# Patient Record
Sex: Male | Born: 1970
Health system: Southern US, Community
[De-identification: ages and names within clinical notes are randomized; demographics above are authoritative.]

## PROBLEM LIST (undated history)

## (undated) DIAGNOSIS — G809 Cerebral palsy, unspecified: Secondary | ICD-10-CM

## (undated) DIAGNOSIS — F32A Depression, unspecified: Secondary | ICD-10-CM

## (undated) DIAGNOSIS — F79 Unspecified intellectual disabilities: Secondary | ICD-10-CM

## (undated) DIAGNOSIS — K219 Gastro-esophageal reflux disease without esophagitis: Secondary | ICD-10-CM

## (undated) DIAGNOSIS — R569 Unspecified convulsions: Secondary | ICD-10-CM

## (undated) DIAGNOSIS — F329 Major depressive disorder, single episode, unspecified: Secondary | ICD-10-CM

## (undated) DIAGNOSIS — F419 Anxiety disorder, unspecified: Secondary | ICD-10-CM

## (undated) HISTORY — PX: OTHER SURGICAL HISTORY: SHX169

---

## 2010-06-17 ENCOUNTER — Emergency Department (HOSPITAL_COMMUNITY): Admission: EM | Admit: 2010-06-17 | Discharge: 2010-06-17 | Payer: Self-pay | Admitting: Emergency Medicine

## 2010-12-23 LAB — CBC
HCT: 45.2 % (ref 39.0–52.0)
Hemoglobin: 15.5 g/dL (ref 13.0–17.0)
MCHC: 34.2 g/dL (ref 30.0–36.0)
WBC: 5.9 10*3/uL (ref 4.0–10.5)

## 2010-12-23 LAB — COMPREHENSIVE METABOLIC PANEL
ALT: 34 U/L (ref 0–53)
Alkaline Phosphatase: 74 U/L (ref 39–117)
CO2: 32 mEq/L (ref 19–32)
GFR calc non Af Amer: >60 mL/min (ref >60)
Glucose, Bld: 106 mg/dL — ABNORMAL HIGH (ref 70–99)
Potassium: 4.4 mEq/L (ref 3.5–5.1)
Sodium: 134 mEq/L — ABNORMAL LOW (ref 135–145)
Total Bilirubin: 0.4 mg/dL (ref 0.3–1.2)

## 2010-12-23 LAB — URINALYSIS, ROUTINE W REFLEX MICROSCOPIC
Ketones, ur: NEGATIVE mg/dL
Nitrite: NEGATIVE
pH: >9 — ABNORMAL HIGH (ref 5.0–8.0)

## 2010-12-23 LAB — ETHANOL: Alcohol, Ethyl (B): <5 mg/dL (ref 0–10)

## 2010-12-23 LAB — RAPID URINE DRUG SCREEN, HOSP PERFORMED: Cocaine: NOT DETECTED

## 2010-12-23 LAB — DIFFERENTIAL
Basophils Relative: 0 % (ref 0–1)
Eosinophils Absolute: 0 10*3/uL (ref 0.0–0.7)
Neutrophils Relative %: 72 % (ref 43–77)

## 2012-05-20 ENCOUNTER — Emergency Department (HOSPITAL_COMMUNITY)
Admission: EM | Admit: 2012-05-20 | Discharge: 2012-05-20 | Payer: Medicare Other | Attending: Emergency Medicine | Admitting: Emergency Medicine

## 2012-05-20 ENCOUNTER — Encounter (HOSPITAL_COMMUNITY): Payer: Self-pay | Admitting: *Deleted

## 2012-05-20 DIAGNOSIS — L0291 Cutaneous abscess, unspecified: Secondary | ICD-10-CM

## 2012-05-20 DIAGNOSIS — G809 Cerebral palsy, unspecified: Secondary | ICD-10-CM | POA: Insufficient documentation

## 2012-05-20 DIAGNOSIS — F79 Unspecified intellectual disabilities: Secondary | ICD-10-CM | POA: Insufficient documentation

## 2012-05-20 DIAGNOSIS — G40909 Epilepsy, unspecified, not intractable, without status epilepticus: Secondary | ICD-10-CM | POA: Insufficient documentation

## 2012-05-20 DIAGNOSIS — IMO0002 Reserved for concepts with insufficient information to code with codable children: Secondary | ICD-10-CM | POA: Insufficient documentation

## 2012-05-20 HISTORY — DX: Unspecified intellectual disabilities: F79

## 2012-05-20 HISTORY — DX: Cerebral palsy, unspecified: G80.9

## 2012-05-20 HISTORY — DX: Unspecified convulsions: R56.9

## 2012-05-20 MED ORDER — LIDOCAINE HCL (PF) 1 % IJ SOLN
5.0000 mL | Freq: Once | INTRAMUSCULAR | Status: AC
  Administered 2012-05-20: 5 mL via INTRADERMAL
  Filled 2012-05-20: qty 5

## 2012-05-20 MED ORDER — HYDROCODONE-ACETAMINOPHEN 5-325 MG PO TABS: ORAL_TABLET | ORAL | Status: AC

## 2012-05-20 MED ORDER — SULFAMETHOXAZOLE-TRIMETHOPRIM 800-160 MG PO TABS: 1.0000 | ORAL_TABLET | Freq: Two times a day (BID) | ORAL | Status: AC

## 2012-05-20 NOTE — ED Notes (Signed)
Abscess to left forearm since Friday,

## 2012-05-21 ENCOUNTER — Encounter (HOSPITAL_COMMUNITY): Payer: Self-pay | Admitting: *Deleted

## 2012-05-21 ENCOUNTER — Emergency Department (HOSPITAL_COMMUNITY)
Admission: EM | Admit: 2012-05-21 | Discharge: 2012-05-21 | Disposition: A | Discharge status: Home / Self Care | Payer: Medicare Other | Attending: Emergency Medicine | Admitting: Emergency Medicine

## 2012-05-21 DIAGNOSIS — L0291 Cutaneous abscess, unspecified: Secondary | ICD-10-CM

## 2012-05-21 DIAGNOSIS — Z4801 Encounter for change or removal of surgical wound dressing: Secondary | ICD-10-CM | POA: Insufficient documentation

## 2012-05-21 DIAGNOSIS — F79 Unspecified intellectual disabilities: Secondary | ICD-10-CM | POA: Insufficient documentation

## 2012-05-21 DIAGNOSIS — G809 Cerebral palsy, unspecified: Secondary | ICD-10-CM | POA: Insufficient documentation

## 2012-05-21 NOTE — ED Notes (Signed)
Recheck of lt arm, had I and D done yesterday.

## 2012-05-21 NOTE — ED Provider Notes (Signed)
History     CSN: [id number]  Arrival date & time 05/21/12  1506   First MD Initiated Contact with Patient 05/21/12 1604      Chief Complaint  Patient presents with  . Wound Check    (Consider location/radiation/quality/duration/timing/severity/associated sxs/prior treatment) Patient is a 41 y.o. male presenting with wound check. The history is provided by the patient and a caregiver.  Wound Check  He was treated in the ED yesterday. Previous treatment in the ED includes I&D of abscess. Treatments since wound repair include oral antibiotics. Fever duration: no fever. There has been colored discharge from the wound. The redness has improved. The swelling has improved. The pain has improved. He has no difficulty moving the affected extremity or digit.    Past Medical History  Diagnosis Date  . Seizures   . Mental retardation   . Cerebral palsy     History reviewed. No pertinent past surgical history.  History reviewed. No pertinent family history.  History  Substance Use Topics  . Smoking status: Never Smoker   . Smokeless tobacco: Not on file  . Alcohol Use: No      Review of Systems  Constitutional: Negative for fever and chills.  Gastrointestinal: Negative for nausea and vomiting.  Musculoskeletal: Negative for joint swelling and arthralgias.  Skin: Positive for color change.       Abscess   Hematological: Negative for adenopathy.  All other systems reviewed and are negative.    Allergies  Review of patient's allergies indicates no known allergies.  Home Medications   Current Outpatient Rx  Name Route Sig Dispense Refill  . ACETAMINOPHEN 325 MG PO TABS Oral Take 650 mg by mouth every 6 (six) hours as needed. Pain/Fever    . CARBAMAZEPINE ER 200 MG PO TB12 Oral Take 400 mg by mouth 2 (two) times daily.    Marland Kitchen CARBOXYMETHYLCELLULOSE SODIUM 0.5 % OP SOLN Both Eyes Place 1 drop into both eyes 4 (four) times daily.    Marland Kitchen CLONAZEPAM 0.5 MG PO TABS Oral Take 0.5 mg  by mouth 2 (two) times daily.    Marland Kitchen FLUVOXAMINE MALEATE 100 MG PO TABS Oral Take 100 mg by mouth 2 (two) times daily.    Marland Kitchen GABAPENTIN 800 MG PO TABS Oral Take 800 mg by mouth 4 (four) times daily.    Marland Kitchen HYDROCODONE-ACETAMINOPHEN 5-325 MG PO TABS  Take one-two tabs po q 4-6 hrs prn pain 20 tablet 0  . SULFAMETHOXAZOLE-TRIMETHOPRIM 800-160 MG PO TABS Oral Take 1 tablet by mouth 2 (two) times daily. 28 tablet 0  . ZOLPIDEM TARTRATE 10 MG PO TABS Oral Take 10 mg by mouth at bedtime.      BP 124/80  Pulse 82  Temp 97.6 F (36.4 C) (Oral)  Resp 18  SpO2 99%  Physical Exam  Nursing note and vitals reviewed. Constitutional: He is oriented to person, place, and time. He appears well-developed and well-nourished. No distress.  HENT:  Head: Normocephalic and atraumatic.  Cardiovascular: Normal rate, regular rhythm and normal heart sounds.   Pulmonary/Chest: Effort normal and breath sounds normal.  Neurological: He is alert and oriented to person, place, and time. He exhibits normal muscle tone. Coordination normal.  Skin: Skin is warm. There is erythema.       Abscess to the left upper arm with previus I&D.  Packing in place with mild to moderate drainage still present.  No increasing erythema.  Pt still has full ROM of the arm.  Radial pulse brisk,  distal sensation intact    ED Course  Procedures (including critical care time)  Labs Reviewed - No data to display      MDM    Patient seen here today for recheck of abscess to the left forearm.  Pain improved.  Moderate amt of drainage still present.  Previous iodoform packing was removed by me and abscess was repacked again with 1/4 inch iodoform.  Pt tolerated well.    Surrounding erythema improving.  Pt currently taking bactrim DS.  Pt and care taker agree to return here on Wednesday for another recheck .    Jestina Stephani L. [place], [place] 05/24/12 2225

## 2012-05-23 ENCOUNTER — Encounter (HOSPITAL_COMMUNITY): Payer: Self-pay

## 2012-05-23 ENCOUNTER — Emergency Department (HOSPITAL_COMMUNITY)
Admission: EM | Admit: 2012-05-23 | Discharge: 2012-05-23 | Disposition: A | Discharge status: Home / Self Care | Payer: Medicare Other | Attending: Emergency Medicine | Admitting: Emergency Medicine

## 2012-05-23 DIAGNOSIS — R569 Unspecified convulsions: Secondary | ICD-10-CM | POA: Insufficient documentation

## 2012-05-23 DIAGNOSIS — F79 Unspecified intellectual disabilities: Secondary | ICD-10-CM | POA: Insufficient documentation

## 2012-05-23 DIAGNOSIS — L0291 Cutaneous abscess, unspecified: Secondary | ICD-10-CM

## 2012-05-23 DIAGNOSIS — IMO0002 Reserved for concepts with insufficient information to code with codable children: Secondary | ICD-10-CM | POA: Insufficient documentation

## 2012-05-23 DIAGNOSIS — G809 Cerebral palsy, unspecified: Secondary | ICD-10-CM | POA: Insufficient documentation

## 2012-05-23 NOTE — ED Provider Notes (Signed)
Medical screening examination/treatment/procedure(s) were performed by non-physician practitioner and as supervising physician I was immediately available for consultation/collaboration.   Benny Lennert, MD 05/23/12 [phone]

## 2012-05-23 NOTE — ED Notes (Signed)
Wound to left arm. Here for recheck.

## 2012-05-23 NOTE — ED Provider Notes (Signed)
History     CSN: [id number]  Arrival date & time 05/23/12  1191   First MD Initiated Contact with Patient 05/23/12 [phone]      Chief Complaint  Patient presents with  . Wound Check    (Consider location/radiation/quality/duration/timing/severity/associated sxs/prior treatment) Patient is a 41 y.o. male presenting with wound check. The history is provided by the patient.  Wound Check  He was treated in the ED 2 to 3 days ago. Previous treatment in the ED includes I&D of abscess. Treatments since wound repair include oral antibiotics and regular soap and water washings. Fever duration: no fever. There has been no drainage from the wound. The redness has improved. The swelling has improved. The pain has no pain. He has no difficulty moving the affected extremity or digit.    Past Medical History  Diagnosis Date  . Seizures   . Mental retardation   . Cerebral palsy     History reviewed. No pertinent past surgical history.  No family history on file.  History  Substance Use Topics  . Smoking status: Never Smoker   . Smokeless tobacco: Not on file  . Alcohol Use: No      Review of Systems  Constitutional: Negative for fever and chills.  Gastrointestinal: Negative for nausea and vomiting.  Musculoskeletal: Negative for joint swelling and arthralgias.  Skin: Positive for color change.       Abscess   Hematological: Negative for adenopathy.  All other systems reviewed and are negative.    Allergies  Review of patient's allergies indicates no known allergies.  Home Medications   Current Outpatient Rx  Name Route Sig Dispense Refill  . ACETAMINOPHEN 325 MG PO TABS Oral Take 650 mg by mouth every 6 (six) hours as needed. Pain/Fever    . CARBAMAZEPINE ER 200 MG PO TB12 Oral Take 400 mg by mouth 2 (two) times daily.    Marland Kitchen CARBOXYMETHYLCELLULOSE SODIUM 0.5 % OP SOLN Both Eyes Place 1 drop into both eyes 4 (four) times daily.    Marland Kitchen CLONAZEPAM 0.5 MG PO TABS Oral Take 0.5 mg  by mouth 2 (two) times daily.    Marland Kitchen FLUVOXAMINE MALEATE 100 MG PO TABS Oral Take 100 mg by mouth 2 (two) times daily.    Marland Kitchen GABAPENTIN 800 MG PO TABS Oral Take 800 mg by mouth 4 (four) times daily.    Marland Kitchen HYDROCODONE-ACETAMINOPHEN 5-325 MG PO TABS  Take one-two tabs po q 4-6 hrs prn pain 20 tablet 0  . SULFAMETHOXAZOLE-TRIMETHOPRIM 800-160 MG PO TABS Oral Take 1 tablet by mouth 2 (two) times daily. 28 tablet 0  . ZOLPIDEM TARTRATE 10 MG PO TABS Oral Take 10 mg by mouth at bedtime.      BP 144/88  Pulse 68  Temp 97.7 F (36.5 C)  Resp 16  SpO2 97%  Physical Exam  Nursing note and vitals reviewed. Constitutional: He is oriented to person, place, and time. He appears well-developed and well-nourished. No distress.  HENT:  Head: Normocephalic and atraumatic.  Cardiovascular: Normal rate, regular rhythm and normal heart sounds.   Pulmonary/Chest: Effort normal and breath sounds normal.  Neurological: He is alert and oriented to person, place, and time. He exhibits normal muscle tone. Coordination normal.  Skin: Skin is warm. There is erythema.       Abscess to the left forearm.  Erythema improved.  Appears to be healing.  No surrounding erythema.  Radial pulse brisk, sensation intact    ED Course  Procedures (including  critical care time)  Labs Reviewed - No data to display   1. Abscess       MDM    Abscess improving, appears to be healing . Pain improved.   Packing removed yesterday during dressing change at the nursing home.  Advised pt and care giver to cont abx, warm soaks and return if needed  The patient appears reasonably screened and/or stabilized for discharge and I doubt any other medical condition or other [facility] requiring further screening, evaluation, or treatment in the ED at this time prior to discharge.     Makalyn Lennox L. Deal[place] Koel[place], [place] 05/23/12 1019

## 2012-05-24 NOTE — ED Provider Notes (Signed)
History     CSN: [id number]  Arrival date & time 05/20/12  4098   First MD Initiated Contact with Patient 05/20/12 1015      Chief Complaint  Patient presents with  . Abscess    (Consider location/radiation/quality/duration/timing/severity/associated sxs/prior treatment) Patient is a 41 y.o. male presenting with abscess. The history is provided by the patient and a caregiver.  Abscess  This is a new problem. The current episode started less than one week ago. The onset was gradual. The problem occurs occasionally. The problem has been gradually worsening. The abscess is present on the left arm. The problem is moderate. The abscess is characterized by painfulness, redness and swelling. It is unknown what he was exposed to. Pertinent negatives include no decrease in physical activity, no fever and no vomiting. His past medical history does not include skin abscesses in family. There were no sick contacts. He has received no recent medical care.    Past Medical History  Diagnosis Date  . Seizures   . Mental retardation   . Cerebral palsy     History reviewed. No pertinent past surgical history.  No family history on file.  History  Substance Use Topics  . Smoking status: Never Smoker   . Smokeless tobacco: Not on file  . Alcohol Use: No      Review of Systems  Constitutional: Negative for fever and chills.  Gastrointestinal: Negative for nausea and vomiting.  Musculoskeletal: Negative for joint swelling and arthralgias.  Skin: Positive for color change.       Abscess   Hematological: Negative for adenopathy.  All other systems reviewed and are negative.    Allergies  Review of patient's allergies indicates no known allergies.  Home Medications   Current Outpatient Rx  Name Route Sig Dispense Refill  . ACETAMINOPHEN 325 MG PO TABS Oral Take 650 mg by mouth every 6 (six) hours as needed. Pain/Fever    . CARBAMAZEPINE ER 200 MG PO TB12 Oral Take 400 mg by mouth 2  (two) times daily.    Marland Kitchen CARBOXYMETHYLCELLULOSE SODIUM 0.5 % OP SOLN Both Eyes Place 1 drop into both eyes 4 (four) times daily.    Marland Kitchen CLONAZEPAM 0.5 MG PO TABS Oral Take 0.5 mg by mouth 2 (two) times daily.    Marland Kitchen FLUVOXAMINE MALEATE 100 MG PO TABS Oral Take 100 mg by mouth 2 (two) times daily.    Marland Kitchen GABAPENTIN 800 MG PO TABS Oral Take 800 mg by mouth 4 (four) times daily.    Marland Kitchen ZOLPIDEM TARTRATE 10 MG PO TABS Oral Take 10 mg by mouth at bedtime.    Marland Kitchen HYDROCODONE-ACETAMINOPHEN 5-325 MG PO TABS  Take one-two tabs po q 4-6 hrs prn pain 20 tablet 0  . SULFAMETHOXAZOLE-TRIMETHOPRIM 800-160 MG PO TABS Oral Take 1 tablet by mouth 2 (two) times daily. 28 tablet 0    BP 133/90  Pulse 85  Temp 97.4 F (36.3 C)  Resp 18  SpO2 99%  Physical Exam  Nursing note and vitals reviewed. Constitutional: He is oriented to person, place, and time. He appears well-developed and well-nourished. No distress.  HENT:  Head: Normocephalic and atraumatic.  Cardiovascular: Normal rate, regular rhythm and normal heart sounds.   Pulmonary/Chest: Effort normal and breath sounds normal.  Neurological: He is alert and oriented to person, place, and time. He exhibits normal muscle tone. Coordination normal.  Skin: Skin is warm. There is erythema.          Abscess to the  left upper arm.  Indurated with mild fluctuance.  No red streaks or drainage.     ED Course  Procedures (including critical care time)  Labs Reviewed - No data to display No results found.   1. Abscess       MDM   INCISION AND DRAINAGE Performed by: Pauline Aus L. Consent: Verbal consent obtained. Risks and benefits: risks, benefits and alternatives were discussed Type: abscess  Body area: left upper arm  Anesthesia: local infiltration  Local anesthetic: lidocaine 1% w/o epinephrine  Anesthetic total: 2 ml  Complexity: complex Blunt dissection to break up loculations  Drainage: purulent  Drainage amount: copious  Packing  material: 1/4 in iodoform gauze  Patient tolerance: Patient tolerated the procedure well with no immediate complications.    Patient is well appearing, moves left arm, elbow and wrist w/o difficulty.  Elbow joint is not involved.  Pt and care giver agree to return here tomorrow for recheck.  The patient appears reasonably screened and/or stabilized for discharge and I doubt any other medical condition or other [facility] requiring further screening, evaluation, or treatment in the ED at this time prior to discharge.   Prescribed: Bactrim DS norco #20     Bibi Economos L. [place], [place] 05/24/12 1811

## 2012-05-27 NOTE — ED Provider Notes (Signed)
Medical screening examination/treatment/procedure(s) were performed by non-physician practitioner and as supervising physician I was immediately available for consultation/collaboration.   Shelda Jakes, MD 05/27/12 [phone]

## 2012-05-28 NOTE — ED Provider Notes (Signed)
Medical screening examination/treatment/procedure(s) were performed by non-physician practitioner and as supervising physician I was immediately available for consultation/collaboration.  Donnetta Hutching, MD 05/28/12 [phone]

## 2014-03-04 ENCOUNTER — Encounter (HOSPITAL_COMMUNITY): Payer: Self-pay | Admitting: Emergency Medicine

## 2014-03-04 ENCOUNTER — Emergency Department (HOSPITAL_COMMUNITY): Payer: Medicare Other

## 2014-03-04 ENCOUNTER — Emergency Department (HOSPITAL_COMMUNITY)
Admission: EM | Admit: 2014-03-04 | Discharge: 2014-03-04 | Disposition: A | Discharge status: Home / Self Care | Payer: Medicare Other | Attending: Emergency Medicine | Admitting: Emergency Medicine

## 2014-03-04 DIAGNOSIS — Y9389 Activity, other specified: Secondary | ICD-10-CM | POA: Insufficient documentation

## 2014-03-04 DIAGNOSIS — T07XXXA Unspecified multiple injuries, initial encounter: Secondary | ICD-10-CM

## 2014-03-04 DIAGNOSIS — Z23 Encounter for immunization: Secondary | ICD-10-CM | POA: Insufficient documentation

## 2014-03-04 DIAGNOSIS — G40909 Epilepsy, unspecified, not intractable, without status epilepticus: Secondary | ICD-10-CM | POA: Insufficient documentation

## 2014-03-04 DIAGNOSIS — Y92410 Unspecified street and highway as the place of occurrence of the external cause: Secondary | ICD-10-CM | POA: Insufficient documentation

## 2014-03-04 DIAGNOSIS — S0990XA Unspecified injury of head, initial encounter: Secondary | ICD-10-CM

## 2014-03-04 DIAGNOSIS — Z79899 Other long term (current) drug therapy: Secondary | ICD-10-CM | POA: Insufficient documentation

## 2014-03-04 DIAGNOSIS — S0560XA Penetrating wound without foreign body of unspecified eyeball, initial encounter: Secondary | ICD-10-CM | POA: Insufficient documentation

## 2014-03-04 DIAGNOSIS — IMO0002 Reserved for concepts with insufficient information to code with codable children: Secondary | ICD-10-CM | POA: Insufficient documentation

## 2014-03-04 DIAGNOSIS — F79 Unspecified intellectual disabilities: Secondary | ICD-10-CM | POA: Insufficient documentation

## 2014-03-04 MED ORDER — TETANUS-DIPHTH-ACELL PERTUSSIS 5-2.5-18.5 LF-MCG/0.5 IM SUSP
0.5000 mL | Freq: Once | INTRAMUSCULAR | Status: AC
  Administered 2014-03-04: 0.5 mL via INTRAMUSCULAR
  Filled 2014-03-04: qty 0.5

## 2014-03-04 NOTE — ED Notes (Signed)
Pt presents with abrasion and bruising above right eye. Pt states he fell while getting out of his [employer].

## 2014-03-04 NOTE — Discharge Instructions (Signed)
Abrasion  An abrasion is a cut or scrape of the skin. Abrasions do not extend through all layers of the skin and most heal within 10 days. It is important to care for your abrasion properly to prevent infection.  CAUSES   Most abrasions are caused by falling on, or gliding across, the ground or other surface. When your skin rubs on something, the outer and inner layer of skin rubs off, causing an abrasion.  DIAGNOSIS   Your caregiver will be able to diagnose an abrasion during a physical exam.   TREATMENT   Your treatment depends on how large and deep the abrasion is. Generally, your abrasion will be cleaned with water and a mild soap to remove any dirt or debris. An antibiotic ointment may be put over the abrasion to prevent an infection. A bandage (dressing) may be wrapped around the abrasion to keep it from getting dirty.   You may need a tetanus shot if:  · You cannot remember when you had your last tetanus shot.  · You have never had a tetanus shot.  · The injury broke your skin.  If you get a tetanus shot, your arm may swell, get red, and feel warm to the touch. This is common and not a problem. If you need a tetanus shot and you choose not to have one, there is a rare chance of getting tetanus. Sickness from tetanus can be serious.   HOME CARE INSTRUCTIONS   · If a dressing was applied, change it at least once a day or as directed by your caregiver. If the bandage sticks, soak it off with warm water.    · Wash the area with water and a mild soap to remove all the ointment 2 times a day. Rinse off the soap and pat the area dry with a clean towel.    · Reapply any ointment as directed by your caregiver. This will help prevent infection and keep the bandage from sticking. Use gauze over the wound and under the dressing to help keep the bandage from sticking.    · Change your dressing right away if it becomes wet or dirty.    · Only take over-the-counter or prescription medicines for pain, discomfort, or fever as  directed by your caregiver.    · Follow up with your caregiver within 24 48 hours for a wound check, or as directed. If you were not given a wound-check appointment, look closely at your abrasion for redness, swelling, or pus. These are signs of infection.  SEEK IMMEDIATE MEDICAL CARE IF:   · You have increasing pain in the wound.    · You have redness, swelling, or tenderness around the wound.    · You have pus coming from the wound.    · You have a fever or persistent symptoms for more than 2 3 days.  · You have a fever and your symptoms suddenly get worse.  · You have a bad smell coming from the wound or dressing.    MAKE SURE YOU:   · Understand these instructions.  · Will watch your condition.  · Will get help right away if you are not doing well or get worse.  Document Released: 07/06/2005 Document Revised: 09/12/2012 Document Reviewed: 08/30/2011  ExitCare® Patient Information ©2014 [employer].    Head Injury, Adult  You have received a head injury. It does not appear serious at this time. Headaches and vomiting are common following head injury. It should be easy to awaken from sleeping. Sometimes it is necessary   for you to stay in the emergency department for a while for observation. Sometimes admission to the hospital may be needed. After injuries such as yours, most problems occur within the first 24 hours, but side effects may occur up to 7 10 days after the injury. It is important for you to carefully monitor your condition and contact your health care provider or seek immediate medical care if there is a change in your condition.  WHAT ARE THE TYPES OF HEAD INJURIES?  Head injuries can be as minor as a bump. Some head injuries can be more severe. More severe head injuries include:  · A jarring injury to the brain (concussion).  · A bruise of the brain (contusion). This mean there is bleeding in the brain that can cause swelling.  · A cracked skull (skull fracture).  · Bleeding in the brain that  collects, clots, and forms a bump (hematoma).  WHAT CAUSES A HEAD INJURY?  A serious head injury is most likely to happen to someone who is in a car wreck and is not wearing a seat belt. Other causes of major head injuries include bicycle or motorcycle accidents, sports injuries, and falls.  HOW ARE HEAD INJURIES DIAGNOSED?  A complete history of the event leading to the injury and your current symptoms will be helpful in diagnosing head injuries. Many times, pictures of the brain, such as CT or MRI are needed to see the extent of the injury. Often, an overnight hospital stay is necessary for observation.   WHEN SHOULD I SEEK IMMEDIATE MEDICAL CARE?   You should get help right away if:  · You have confusion or drowsiness.  · You feel sick to your stomach (nauseous) or have continued, forceful vomiting.  · You have dizziness or unsteadiness that is getting worse.  · You have severe, continued headaches not relieved by medicine. Only take over-the-counter or prescription medicines for pain, fever, or discomfort as directed by your health care provider.  · You do not have normal function of the arms or legs or are unable to walk.  · You notice changes in the black spots in the center of the colored part of your eye (pupil).  · You have a clear or bloody fluid coming from your nose or ears.  · You have a loss of vision.  During the next 24 hours after the injury, you must stay with someone who can watch you for the warning signs. This person should contact local emergency services (911 in the [place] if you have seizures, you become unconscious, or you are unable to wake up.  HOW CAN I PREVENT A HEAD INJURY IN THE FUTURE?  The most important factor for preventing major head injuries is avoiding motor vehicle accidents.  To minimize the potential for damage to your head, it is crucial to wear seat belts while riding in motor vehicles. Wearing helmets while bike riding and playing collision sports (like football) is also  helpful. Also, avoiding dangerous activities around the house will further help reduce your risk of head injury.   WHEN CAN I RETURN TO NORMAL ACTIVITIES AND ATHLETICS?  You should be reevaluated by your health care provider before returning to these activities. If you have any of the following symptoms, you should not return to activities or contact sports until 1 week after the symptoms have stopped:  · Persistent headache.  · Dizziness or vertigo.  · Poor attention and concentration.  · Confusion.  · Memory problems.  ·   Nausea or vomiting.  · Fatigue or tire easily.  · Irritability.  · Intolerant of bright lights or loud noises.  · Anxiety or depression.  · Disturbed sleep.  MAKE SURE YOU:   · Understand these instructions.  · Will watch your condition.  · Will get help right away if you are not doing well or get worse.  Document Released: 09/26/2005 Document Revised: 07/17/2013 Document Reviewed: 06/03/2013  ExitCare® Patient Information ©2014 [employer].

## 2014-03-04 NOTE — ED Notes (Signed)
Wound care complete

## 2014-03-04 NOTE — ED Provider Notes (Signed)
CSN: [id number]     Arrival date & time 03/04/14  1346 History  This chart was scribed for Tom MelterElliott L Sanchez Hemmer, MD by Danella Maiersaroline Early, ED Scribe. This patient was seen in room APA16A/APA16A and the patient's care was started at 5:10 PM.    Chief Complaint  Patient presents with  . Fall   The history is provided by the patient. No language interpreter was used.   HPI Comments: Tom Russell is a 43 y.o. male who presents to the Emergency Department complaining of a small laceration above the right eye with associated pain and bruising since falling out of a van around 2pm today. Caregiver states it was a Actorregular-sized van and pt fell about 2 feet. He was helped up immediately. He also reports a small abrasion to his right middle finger with some pain. Denies numbness. He denies bumping his teeth or problems anywhere else.   Past Medical History  Diagnosis Date  . Seizures   . Mental retardation   . Cerebral palsy    History reviewed. No pertinent past surgical history. History reviewed. No pertinent family history. History  Substance Use Topics  . Smoking status: Never Smoker   . Smokeless tobacco: Not on file  . Alcohol Use: No    Review of Systems  Constitutional: Negative for fever.  Skin: Positive for wound.  Neurological: Negative for numbness.  All other systems reviewed and are negative.     Allergies  Review of patient's allergies indicates no known allergies.  Home Medications   Prior to Admission medications   Medication Sig Start Date End Date Taking? Authorizing Provider  acetaminophen (TYLENOL) 325 MG tablet Take 650 mg by mouth every 6 (six) hours as needed. Pain/Fever    Historical Provider, MD  carbamazepine (TEGRETOL XR) 200 MG 12 hr tablet Take 400 mg by mouth 2 (two) times daily.    Historical Provider, MD  carboxymethylcellulose (REFRESH PLUS) 0.5 % SOLN Place 1 drop into both eyes 4 (four) times daily.    Historical Provider, MD  clonazePAM (KLONOPIN) 0.5  MG tablet Take 0.5 mg by mouth 2 (two) times daily.    Historical Provider, MD  fluvoxaMINE (LUVOX) 100 MG tablet Take 100 mg by mouth 2 (two) times daily.    Historical Provider, MD  gabapentin (NEURONTIN) 800 MG tablet Take 800 mg by mouth 4 (four) times daily.    Historical Provider, MD  zolpidem (AMBIEN) 10 MG tablet Take 10 mg by mouth at bedtime.    Historical Provider, MD   BP 139/88  Pulse 113  Temp(Src) 98.2 F (36.8 C) (Oral)  Resp 18  SpO2 97% Physical Exam  Nursing note and vitals reviewed. Constitutional: He is oriented to person, place, and time. He appears well-developed and well-nourished.  HENT:  Head: Normocephalic and atraumatic.  Right Ear: External ear normal.  Left Ear: External ear normal.  Eyes: Conjunctivae and EOM are normal. Pupils are equal, round, and reactive to light.  OS on the right eye. Otherwise normal EOMIs.  Neck: Normal range of motion and phonation normal. Neck supple.  No cervical spine tenderness  Cardiovascular: Normal rate, regular rhythm, normal heart sounds and intact distal pulses.   Pulmonary/Chest: Effort normal and breath sounds normal. He exhibits no tenderness and no bony tenderness.  Abdominal: Soft. There is no tenderness.  Musculoskeletal: Normal range of motion.  No thoracic or lumbar spine tenderness  Neurological: He is alert and oriented to person, place, and time. No cranial nerve deficit  or sensory deficit. He exhibits normal muscle tone. Coordination normal.  Skin: Skin is warm, dry and intact.  Psychiatric: He has a normal mood and affect. His behavior is normal. Judgment and thought content normal.    ED Course  Procedures (including critical care time)  DIAGNOSTIC STUDIES: Oxygen Saturation is 97% on RA, normal by my interpretation.    COORDINATION OF CARE: Medications  Tdap (BOOSTRIX) injection 0.5 mL (0.5 mLs Intramuscular Given 03/04/14 1853)   Patient Vitals for the past 24 hrs:  BP Temp Temp src Pulse Resp  SpO2  03/04/14 1714 131/99 mmHg - - 82 16 98 %  03/04/14 1412 139/88 mmHg 98.2 F (36.8 C) Oral 113 18 97 %    5:18 PM- Discussed treatment plan with pt which includes CT head and Tdap injection. Pt agrees to plan.   Labs Review Labs Reviewed - No data to display  Imaging Review Ct Head Wo Contrast  03/04/2014   CLINICAL DATA:  Seizures, mental retardation, cerebral palsy, fell out of a van today, RIGHT supreorbital laceration and bruising  EXAM: CT HEAD WITHOUT CONTRAST  TECHNIQUE: Contiguous axial images were obtained from the base of the skull through the vertex without intravenous contrast.  COMPARISON:  09/28/2010  FINDINGS: Generalized atrophy.  Calcification and encephalomalacia identified at the medial frontal lobes bilaterally compatible with old anterior cerebral artery infarcts.  Slight prominence of the ventricular system stable since previous exam.  No midline shift or mass effect.  Significant cerebellar atrophy.  No intracranial hemorrhage, mass lesion, or evidence of acute infarction.  No extra-axial fluid collections.  Dysconjugate gaze.  Paranasal sinuses and mastoid air cells clear.  Calvaria intact.  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  Cerebellar atrophy.  No definite acute intracranial abnormalities.   Electronically Signed   By: Ulyses Southward M.D.   On: 03/04/2014 18:28     EKG Interpretation None      MDM   Final diagnoses:  Head injury  Abrasion, multiple sites    Accidental fall without serious injury.  Nursing Notes Reviewed/ Care Coordinated Applicable Imaging Reviewed Interpretation of Laboratory Data incorporated into ED treatment  The patient appears reasonably screened and/or stabilized for discharge and I doubt any other medical condition or other [facility] requiring further screening, evaluation, or treatment in the ED at this time prior to discharge.  Plan: Home Medications- usual; Home Treatments- rest; return here if the  recommended treatment, does not improve the symptoms; Recommended follow up- PCP or Here prn  I personally performed the services described in this documentation, which was scribed in my presence. The recorded information has been reviewed and is accurate.     Tom Melter, MD 03/05/14 [phone]

## 2014-03-04 NOTE — ED Notes (Signed)
Pt was out with day program, took off seat belt and tumbled out of the [place], caregiver denies LOC

## 2014-03-04 NOTE — ED Notes (Signed)
Patient given discharge instruction, verbalized understand. Patient wheelchair out of the department.  

## 2015-03-31 IMAGING — CT CT HEAD W/O CM
1 series · 16 of 30 positions shown, 20 images · non-contrast
Comparison: 09/28/2010

CLINICAL DATA: Seizures, mental retardation, cerebral palsy, fell
out of [place] [place] today, RIGHT supreorbital laceration and bruising

EXAM:
CT HEAD WITHOUT CONTRAST
TECHNIQUE: Contiguous axial images were obtained from the base of the skull
through the vertex without intravenous contrast.

[Series 2: headtrauma 4.8 h37s · axial · 0.43mm/px · z∈[+160,+295]mm · 16 of 30 slices shown, 20 images]
[im 2/30  brain]
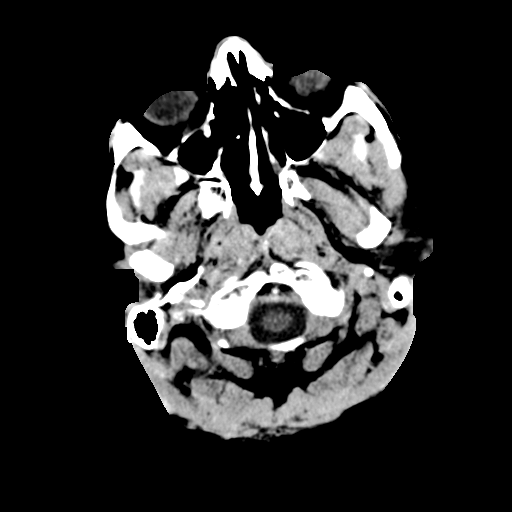
[im 2/30  bone]
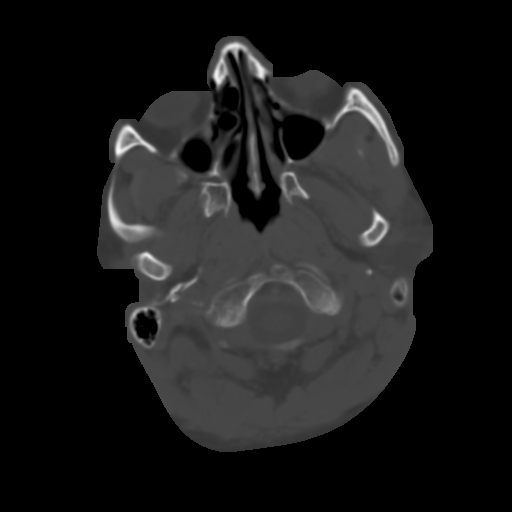
[im 4/30  brain]
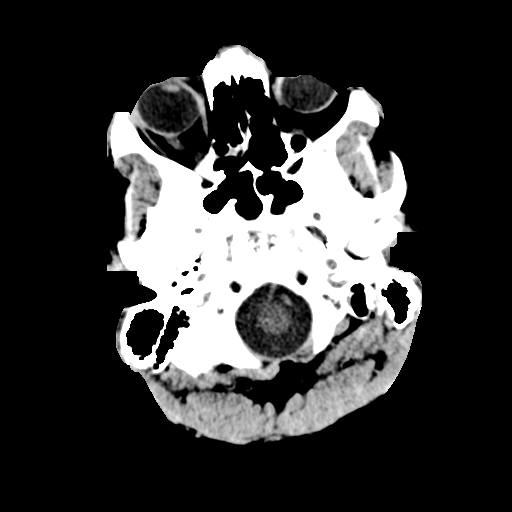
[im 6/30  brain]
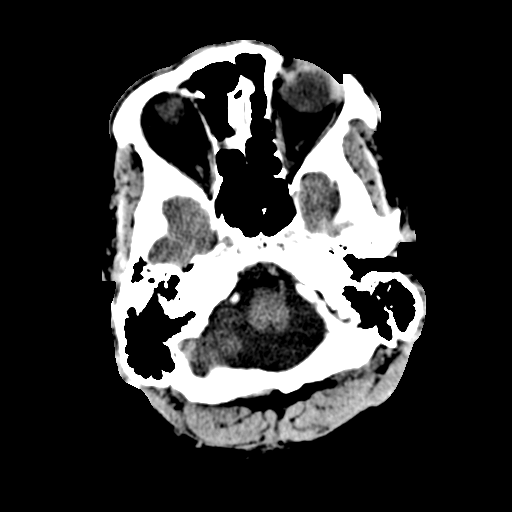
[im 8/30  brain]
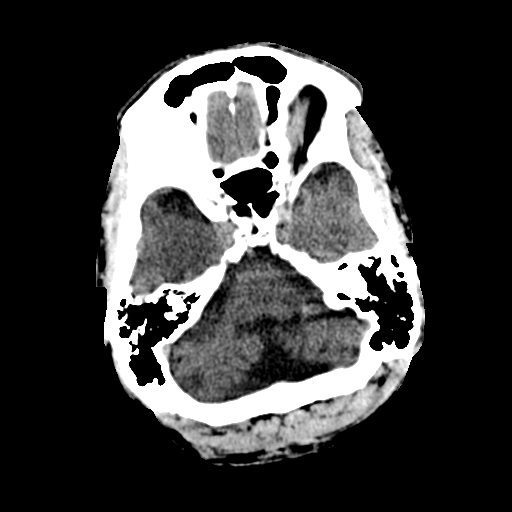
[im 9/30  brain]
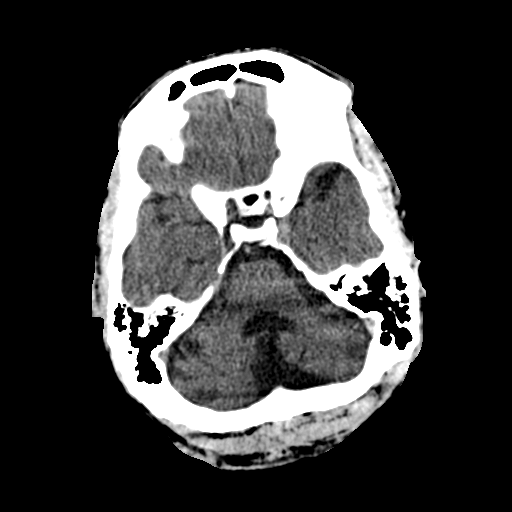
[im 9/30  bone]
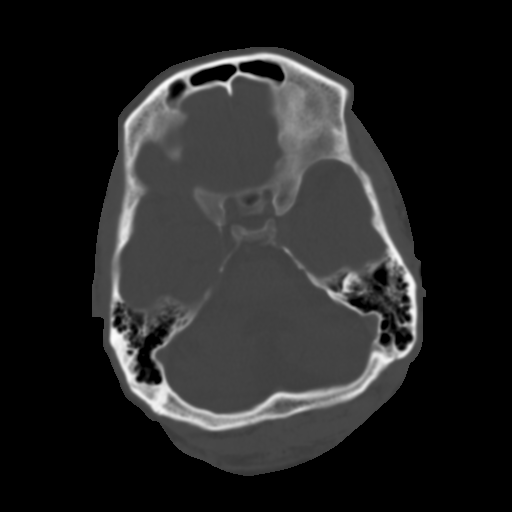
[im 11/30  brain]
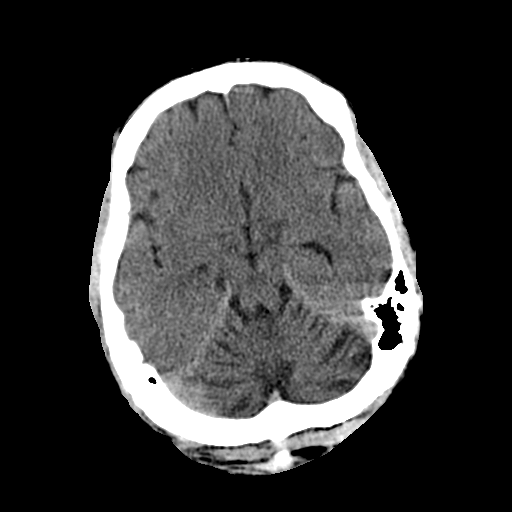
[im 13/30  brain]
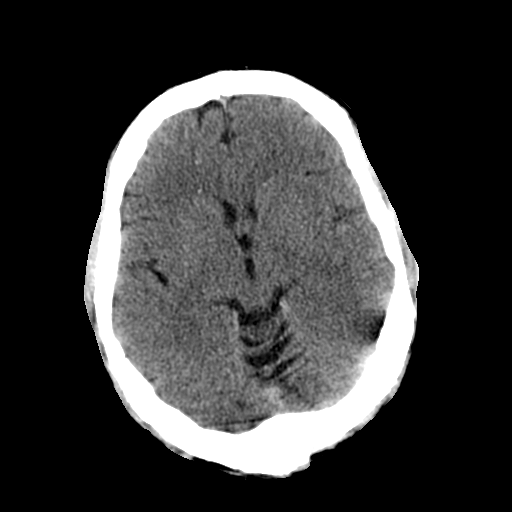
[im 15/30  brain]
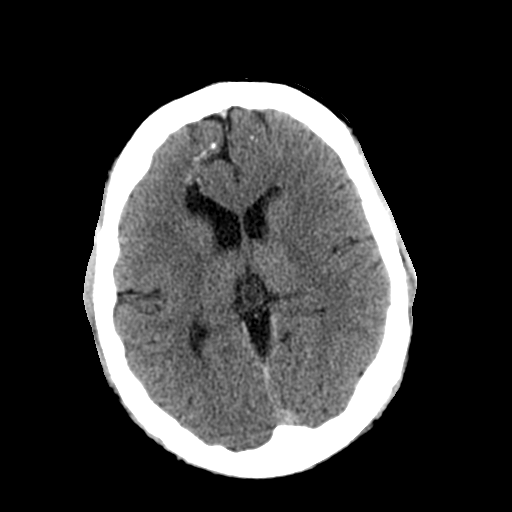
[im 16/30  brain]
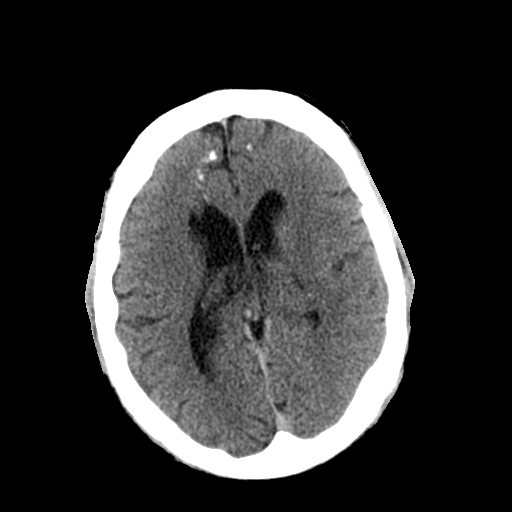
[im 16/30  bone]
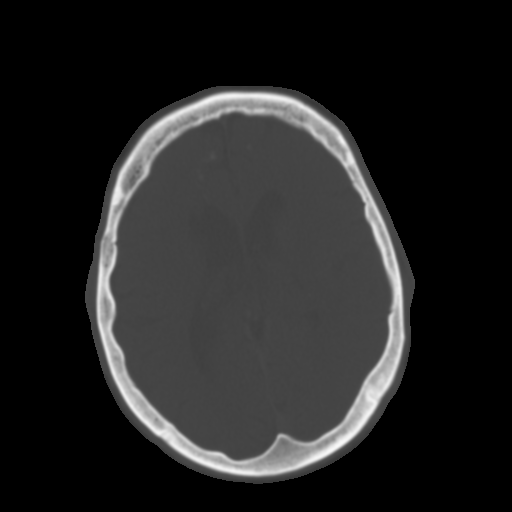
[im 18/30  brain]
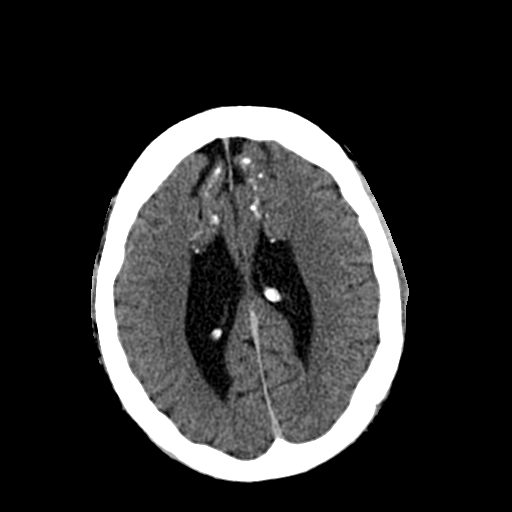
[im 20/30  brain]
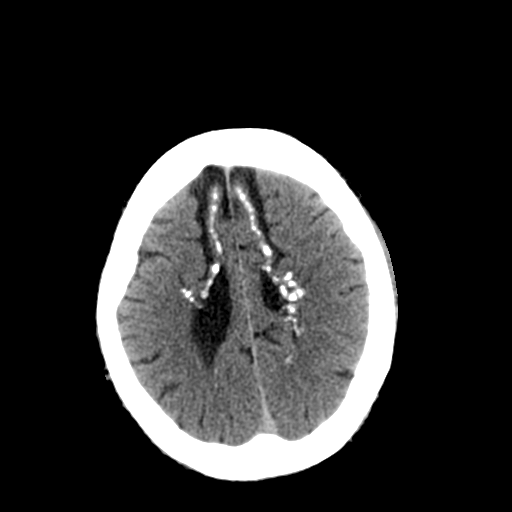
[im 22/30  brain]
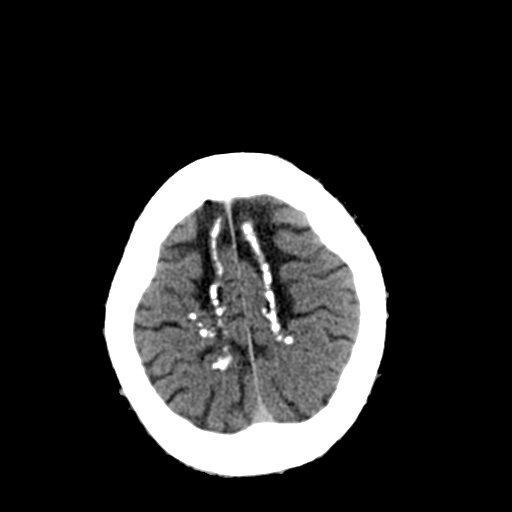
[im 23/30  brain]
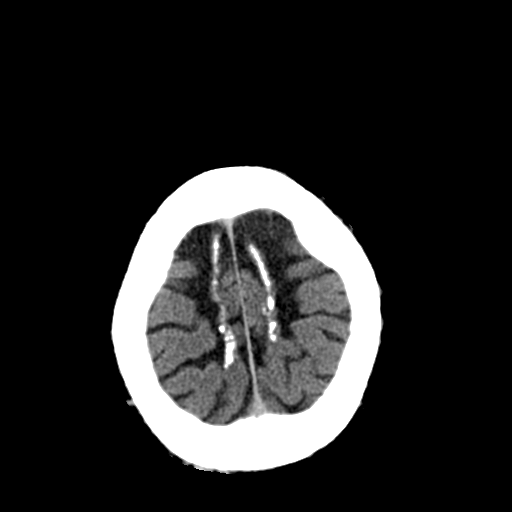
[im 23/30  bone]
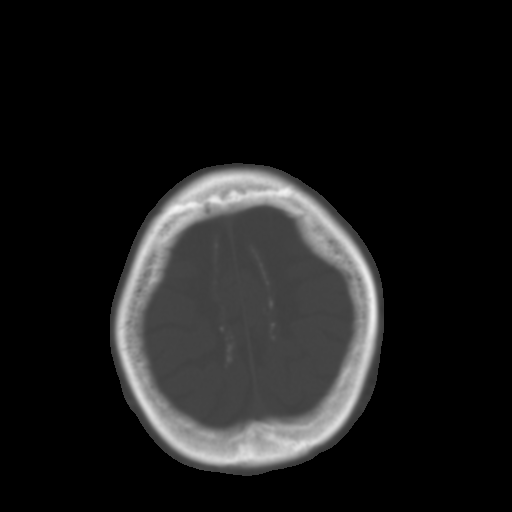
[im 25/30  brain]
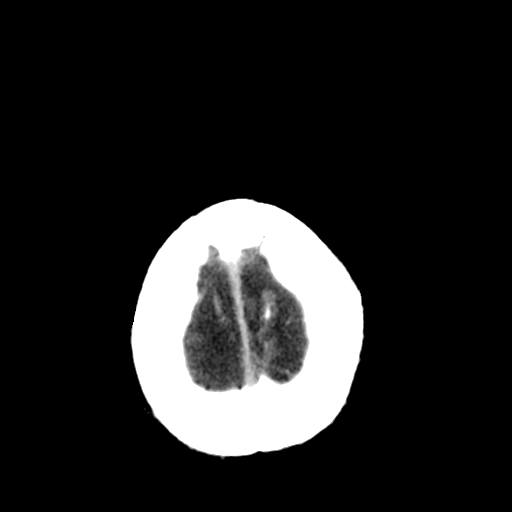
[im 27/30  brain]
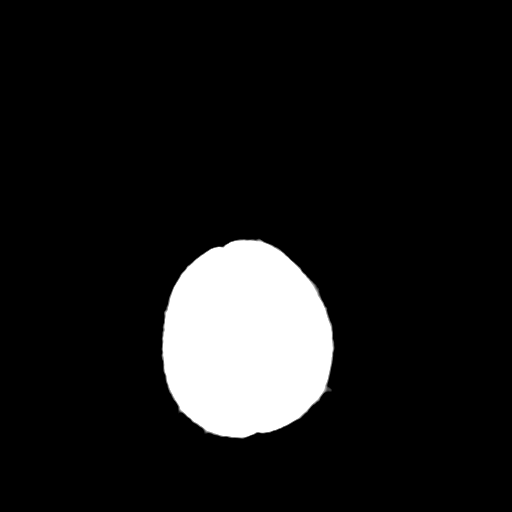
[im 29/30  brain]
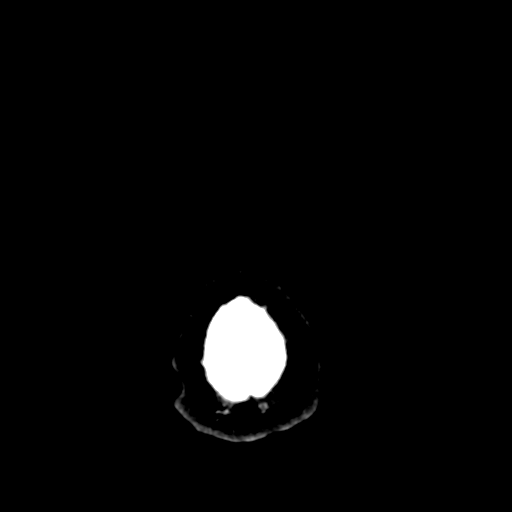

[16 of 30 positions shown; findings below may reference images not displayed]

FINDINGS: Generalized atrophy.

Calcification and encephalomalacia identified at the medial frontal
lobes bilaterally compatible with old anterior cerebral artery
infarcts.

Slight prominence of the ventricular system stable since previous
exam.

No midline shift or mass effect.

Significant cerebellar atrophy.

No intracranial hemorrhage, mass lesion, or evidence of acute
infarction.

No extra-axial fluid collections.

Dysconjugate gaze.

Paranasal sinuses and mastoid air cells clear.

Calvaria intact.
IMPRESSION: Old BILATERAL anterior cerebral artery infarcts.

Cerebellar atrophy.

No definite acute intracranial abnormalities.

## 2015-10-26 DIAGNOSIS — F71 Moderate intellectual disabilities: Secondary | ICD-10-CM | POA: Diagnosis not present

## 2015-10-30 DIAGNOSIS — F79 Unspecified intellectual disabilities: Secondary | ICD-10-CM | POA: Diagnosis not present

## 2015-10-30 DIAGNOSIS — Z418 Encounter for other procedures for purposes other than remedying health state: Secondary | ICD-10-CM | POA: Diagnosis not present

## 2015-10-30 DIAGNOSIS — R269 Unspecified abnormalities of gait and mobility: Secondary | ICD-10-CM | POA: Diagnosis not present

## 2015-10-30 DIAGNOSIS — Z789 Other specified health status: Secondary | ICD-10-CM | POA: Diagnosis not present

## 2015-10-30 DIAGNOSIS — G809 Cerebral palsy, unspecified: Secondary | ICD-10-CM | POA: Diagnosis not present

## 2015-10-30 DIAGNOSIS — R569 Unspecified convulsions: Secondary | ICD-10-CM | POA: Diagnosis not present

## 2016-01-12 DIAGNOSIS — H1013 Acute atopic conjunctivitis, bilateral: Secondary | ICD-10-CM | POA: Diagnosis not present

## 2016-01-18 DIAGNOSIS — F71 Moderate intellectual disabilities: Secondary | ICD-10-CM | POA: Diagnosis not present

## 2016-05-09 DIAGNOSIS — F71 Moderate intellectual disabilities: Secondary | ICD-10-CM | POA: Diagnosis not present

## 2016-06-06 DIAGNOSIS — R569 Unspecified convulsions: Secondary | ICD-10-CM | POA: Diagnosis not present

## 2016-06-06 DIAGNOSIS — F79 Unspecified intellectual disabilities: Secondary | ICD-10-CM | POA: Diagnosis not present

## 2016-06-06 DIAGNOSIS — R110 Nausea: Secondary | ICD-10-CM | POA: Diagnosis not present

## 2016-06-06 DIAGNOSIS — G809 Cerebral palsy, unspecified: Secondary | ICD-10-CM | POA: Diagnosis not present

## 2016-06-06 DIAGNOSIS — Z299 Encounter for prophylactic measures, unspecified: Secondary | ICD-10-CM | POA: Diagnosis not present

## 2016-08-02 DIAGNOSIS — F71 Moderate intellectual disabilities: Secondary | ICD-10-CM | POA: Diagnosis not present

## 2016-08-03 DIAGNOSIS — Z23 Encounter for immunization: Secondary | ICD-10-CM | POA: Diagnosis not present

## 2016-08-08 DIAGNOSIS — Z1389 Encounter for screening for other disorder: Secondary | ICD-10-CM | POA: Diagnosis not present

## 2016-08-08 DIAGNOSIS — Z6824 Body mass index (BMI) 24.0-24.9, adult: Secondary | ICD-10-CM | POA: Diagnosis not present

## 2016-08-08 DIAGNOSIS — Z1211 Encounter for screening for malignant neoplasm of colon: Secondary | ICD-10-CM | POA: Diagnosis not present

## 2016-08-08 DIAGNOSIS — Z0000 Encounter for general adult medical examination without abnormal findings: Secondary | ICD-10-CM | POA: Diagnosis not present

## 2016-08-08 DIAGNOSIS — Z299 Encounter for prophylactic measures, unspecified: Secondary | ICD-10-CM | POA: Diagnosis not present

## 2016-08-09 DIAGNOSIS — Z79899 Other long term (current) drug therapy: Secondary | ICD-10-CM | POA: Diagnosis not present

## 2016-08-09 DIAGNOSIS — Z125 Encounter for screening for malignant neoplasm of prostate: Secondary | ICD-10-CM | POA: Diagnosis not present

## 2016-08-09 DIAGNOSIS — R5383 Other fatigue: Secondary | ICD-10-CM | POA: Diagnosis not present

## 2016-08-09 DIAGNOSIS — E785 Hyperlipidemia, unspecified: Secondary | ICD-10-CM | POA: Diagnosis not present

## 2016-08-29 ENCOUNTER — Encounter: Payer: Self-pay | Admitting: Internal Medicine

## 2016-09-13 ENCOUNTER — Ambulatory Visit: Payer: Medicare Other | Admitting: Nurse Practitioner

## 2016-09-13 ENCOUNTER — Encounter: Payer: Self-pay | Admitting: Nurse Practitioner

## 2016-09-13 ENCOUNTER — Telehealth: Payer: Self-pay | Admitting: Nurse Practitioner

## 2016-09-13 NOTE — Progress Notes (Deleted)
Primary Care Physician:  Ignatius Speckinghruv B Vyas, MD Primary Gastroenterologist:  Dr. Jena Gaussourk  No chief complaint on file.   HPI:   Tom Russell is a 45 y.o. male who presents on referral from primary care for vomiting. PCP notes reviewed.  Today she states   Past Medical History:  Diagnosis Date  . Cerebral palsy   . Mental retardation   . Seizures     No past surgical history on file.  Current Outpatient Prescriptions  Medication Sig Dispense Refill  . acetaminophen (TYLENOL) 325 MG tablet Take 650 mg by mouth every 6 (six) hours as needed. Pain/Fever    . ARIPiprazole (ABILIFY) 5 MG tablet Take 5 mg by mouth every morning.    . carbamazepine (TEGRETOL XR) 200 MG 12 hr tablet Take 200-400 mg by mouth 3 (three) times daily. Takes two tablets in the morning at at bedtime and takes one tablet at noon daily    . carboxymethylcellulose (REFRESH PLUS) 0.5 % SOLN Place 1 drop into both eyes 4 (four) times daily.    . clonazePAM (KLONOPIN) 0.5 MG tablet Take 0.5 mg by mouth 2 (two) times daily.    . fluvoxaMINE (LUVOX) 100 MG tablet Take 100 mg by mouth 2 (two) times daily.    Marland Kitchen. gabapentin (NEURONTIN) 800 MG tablet Take 800 mg by mouth 4 (four) times daily.    Marland Kitchen. zolpidem (AMBIEN) 10 MG tablet Take 10 mg by mouth at bedtime.     No current facility-administered medications for this visit.     Allergies as of 09/13/2016  . (No Known Allergies)    No family history on file.  Social History   Social History  . Marital status: Single    Spouse name: N/A  . Number of children: N/A  . Years of education: N/A   Occupational History  . Not on file.   Social History Main Topics  . Smoking status: Never Smoker  . Smokeless tobacco: Not on file  . Alcohol use No  . Drug use: No  . Sexual activity: Not on file   Other Topics Concern  . Not on file   Social History Narrative  . No narrative on file    Review of Systems: General: Negative for anorexia, weight loss, fever,  chills, fatigue, weakness. Eyes: Negative for vision changes.  ENT: Negative for hoarseness, difficulty swallowing , nasal congestion. CV: Negative for chest pain, angina, palpitations, dyspnea on exertion, peripheral edema.  Respiratory: Negative for dyspnea at rest, dyspnea on exertion, cough, sputum, wheezing.  GI: See history of present illness. GU:  Negative for dysuria, hematuria, urinary incontinence, urinary frequency, nocturnal urination.  MS: Negative for joint pain, low back pain.  Derm: Negative for rash or itching.  Neuro: Negative for weakness, abnormal sensation, seizure, frequent headaches, memory loss, confusion.  Psych: Negative for anxiety, depression, suicidal ideation, hallucinations.  Endo: Negative for unusual weight change.  Heme: Negative for bruising or bleeding. Allergy: Negative for rash or hives.    Physical Exam: There were no vitals taken for this visit. General:   Alert and oriented. Pleasant and cooperative. Well-nourished and well-developed.  Head:  Normocephalic and atraumatic. Eyes:  Without icterus, sclera clear and conjunctiva pink.  Ears:  Normal auditory acuity. Mouth:  No deformity or lesions, oral mucosa pink.  Throat/Neck:  Supple, without mass or thyromegaly. Cardiovascular:  S1, S2 present without murmurs appreciated. Normal pulses noted. Extremities without clubbing or edema. Respiratory:  Clear to auscultation bilaterally. No  wheezes, rales, or rhonchi. No distress.  Gastrointestinal:  +BS, soft, non-tender and non-distended. No HSM noted. No guarding or rebound. No masses appreciated.  Rectal:  Deferred  Musculoskalatal:  Symmetrical without gross deformities. Normal posture. Skin:  Intact without significant lesions or rashes. Neurologic:  Alert and oriented x4;  grossly normal neurologically. Psych:  Alert and cooperative. Normal mood and affect. Heme/Lymph/Immune: No significant cervical adenopathy. No excessive bruising  noted.    09/13/2016 10:01 AM   Disclaimer: This note was dictated with voice recognition software. Similar sounding words can inadvertently be transcribed and may not be corrected upon review.

## 2016-09-13 NOTE — Telephone Encounter (Signed)
PT WAS A NO SHOW AND LETTER SENT  °

## 2016-09-15 NOTE — Telephone Encounter (Signed)
Noted  

## 2016-11-01 DIAGNOSIS — F71 Moderate intellectual disabilities: Secondary | ICD-10-CM | POA: Diagnosis not present

## 2016-11-14 ENCOUNTER — Ambulatory Visit (INDEPENDENT_AMBULATORY_CARE_PROVIDER_SITE_OTHER): Payer: Medicare Other | Admitting: Nurse Practitioner

## 2016-11-14 ENCOUNTER — Encounter: Payer: Self-pay | Admitting: Nurse Practitioner

## 2016-11-14 DIAGNOSIS — R1013 Epigastric pain: Secondary | ICD-10-CM

## 2016-11-14 DIAGNOSIS — R112 Nausea with vomiting, unspecified: Secondary | ICD-10-CM | POA: Diagnosis not present

## 2016-11-14 MED ORDER — ONDANSETRON HCL 8 MG PO TABS: 8.0000 mg | ORAL_TABLET | Freq: Three times a day (TID) | ORAL | 0 refills | Status: DC | PRN

## 2016-11-14 MED ORDER — OMEPRAZOLE 20 MG PO CPDR: 20.0000 mg | DELAYED_RELEASE_CAPSULE | Freq: Every day | ORAL | 3 refills | Status: DC

## 2016-11-14 NOTE — Assessment & Plan Note (Signed)
The patient has intermittent nausea and vomiting per facility staff. Sometimes his vomiting is heralded by nausea and appearance like he is going to get sick. It does not occur every day currently. Started a couple months ago. Difficult historian due to mental retardation and cerebral palsy, limited subjective information. Does admit some dyspepsia symptoms. I will send an Zofran to the pharmacy, return for follow-up in 6 weeks. Dyspepsia management as per below. Call with any problems.

## 2016-11-14 NOTE — Progress Notes (Signed)
CC'ED TO PCP 

## 2016-11-14 NOTE — Progress Notes (Signed)
CC'D TO PCP °

## 2016-11-14 NOTE — Assessment & Plan Note (Signed)
Does admit some limited dyspepsia symptoms including esophageal burning in association with his nausea and vomiting. I will trial him on Prilosec 20 mg once a day to see if this helps. Return for follow-up in 6 weeks, call for any worsening problems.

## 2016-11-14 NOTE — Progress Notes (Signed)
Primary Care Physician:  Kirstie Peri, MD Primary Gastroenterologist:  Dr. Jena Gauss  Chief Complaint  Patient presents with  . Emesis    HPI:   Tom Russell is a 46 y.o. male who presents on referral from primary care for vomiting. No PCP notes were included with the chart today. Patient was previously schedule for OV 09/13/17 but was a no show.  He is a limited historian. Today he is accompanied by a group home staff member who knows some clinical information about it. Started a couple months ago, tried dietary changes without success. Vomiting is intermittent, no hematemesis noted. Noted some nausea before-hand and staff will note some gagging prior to emesis. He admits occasional esophageal burning. Denies constipation and straining. Denies hematochezia, melena. Denies any other upper or lower GI symptoms.  Past Medical History:  Diagnosis Date  . Cerebral palsy (HCC)   . Mental retardation   . Seizures (HCC)     Past Surgical History:  Procedure Laterality Date  . None to date     as of 11/14/16    Current Outpatient Prescriptions  Medication Sig Dispense Refill  . acetaminophen (TYLENOL) 325 MG tablet Take 650 mg by mouth every 6 (six) hours as needed. Pain/Fever    . ARIPiprazole (ABILIFY) 5 MG tablet Take 5 mg by mouth every morning.    . carbamazepine (CARBATROL) 300 MG 12 hr capsule Take by mouth. Takes 2 capsules in the am, 1 capsule at noon, and 2 capsules at bedtime    . diazepam (VALIUM) 5 MG tablet Take 5 mg by mouth 2 (two) times daily.    . fluvoxaMINE (LUVOX) 100 MG tablet Take 100 mg by mouth 2 (two) times daily.    Marland Kitchen gabapentin (NEURONTIN) 800 MG tablet Take 800 mg by mouth 4 (four) times daily.    Marland Kitchen zolpidem (AMBIEN) 10 MG tablet Take 10 mg by mouth at bedtime.     No current facility-administered medications for this visit.     Allergies as of 11/14/2016  . (No Known Allergies)    No family history on file.  Social History   Social History  .  Marital status: Single    Spouse name: N/A  . Number of children: N/A  . Years of education: N/A   Occupational History  . Not on file.   Social History Main Topics  . Smoking status: Never Smoker  . Smokeless tobacco: Never Used  . Alcohol use No  . Drug use: No  . Sexual activity: Not on file   Other Topics Concern  . Not on file   Social History Narrative  . No narrative on file    Review of Systems: Limited ROS due to medical history General: "feels ok" ENT: Negative for hoarseness, difficulty swallowing. CV: Negative for chest pain.  Respiratory: Negative for dyspnea at rest, cough.  GI: See history of present illness. Derm: Negative for rash or itching. Heme: Negative for bruising or bleeding. Allergy: Negative for rash or hives.    Physical Exam: BP 132/82   Pulse 70   Temp 97.7 F (36.5 C)  General:   Alert. Limited historian due to CP and mental retardation history. Pleasant and cooperative. Well-nourished and well-developed.  Head:  Normocephalic and atraumatic. Eyes:  Without icterus, sclera clear and conjunctiva pink.  Ears:  Normal auditory acuity. Cardiovascular:  S1, S2 present without murmurs appreciated. Extremities without clubbing or edema. Respiratory:  Clear to auscultation bilaterally. No wheezes, rales, or rhonchi. No  distress.  Gastrointestinal:  +BS, soft, non-tender and non-distended. No HSM noted. No guarding or rebound. No masses appreciated.  Rectal:  Deferred  Musculoskalatal:  Symmetrical without gross deformities.  Skin:  Intact without significant lesions or rashes. Neurologic:  Alert and oriented x4;  grossly normal neurologically. Psych:  Alert and cooperative. Normal mood and affect. Heme/Lymph/Immune: No excessive bruising noted.    11/14/2016 11:18 AM   Disclaimer: This note was dictated with voice recognition software. Similar sounding words can inadvertently be transcribed and may not be corrected upon review.

## 2016-11-14 NOTE — Patient Instructions (Signed)
1. I have sent in Prilosec 20 mg to the pharmacy. Have him take this once a day, 30 minutes before the first meal of the day. 2. I sent and Zofran 8 mg tabs that he can take up to every 8 hours if needed for nausea and vomiting. 3. Call if any worsening problems. 4. Return for follow-up in 6 weeks.

## 2016-12-26 ENCOUNTER — Ambulatory Visit (INDEPENDENT_AMBULATORY_CARE_PROVIDER_SITE_OTHER): Payer: Medicare Other | Admitting: Nurse Practitioner

## 2016-12-26 ENCOUNTER — Encounter: Payer: Self-pay | Admitting: Nurse Practitioner

## 2016-12-26 VITALS — BP 137/86 | HR 79 | Temp 97.8°F | Ht 66.0 in

## 2016-12-26 DIAGNOSIS — R1013 Epigastric pain: Secondary | ICD-10-CM | POA: Diagnosis not present

## 2016-12-26 DIAGNOSIS — R112 Nausea with vomiting, unspecified: Secondary | ICD-10-CM

## 2016-12-26 MED ORDER — OMEPRAZOLE 20 MG PO CPDR: 20.0000 mg | DELAYED_RELEASE_CAPSULE | Freq: Two times a day (BID) | ORAL | 3 refills | Status: DC

## 2016-12-26 NOTE — Assessment & Plan Note (Signed)
Some persistent nausea. Notable decrease in vomiting now apparently a rare occasion. Prilosec seems to have helped. I'll increase to twice a day dosing. Continue Zofran as needed. Diet recommendations on discharge instructions and return for follow-up in 3 months, or sooner if needed.

## 2016-12-26 NOTE — Patient Instructions (Addendum)
1. I have increased her Prilosec to twice a day. 2. Continue taking Zofran as needed for nausea/vomiting. 3. Below her diet recommendations related to acid reflux for foods to avoid. 4. Return for follow-up in 3 months.     Food Choices for Gastroesophageal Reflux Disease, Adult When you have gastroesophageal reflux disease (GERD), the foods you eat and your eating habits are very important. Choosing the right foods can help ease the discomfort of GERD. Consider working with a diet and nutrition specialist (dietitian) to help you make healthy food choices. What general guidelines should I follow? Eating plan   Choose healthy foods low in fat, such as fruits, vegetables, whole grains, low-fat dairy products, and lean meat, fish, and poultry.  Eat frequent, small meals instead of three large meals each day. Eat your meals slowly, in a relaxed setting. Avoid bending over or lying down until 2-3 hours after eating.  Limit high-fat foods such as fatty meats or fried foods.  Limit your intake of oils, butter, and shortening to less than 8 teaspoons each day.  Avoid the following:  Foods that cause symptoms. These may be different for different people. Keep a food diary to keep track of foods that cause symptoms.  Alcohol.  Drinking large amounts of liquid with meals.  Eating meals during the 2-3 hours before bed.  Cook foods using methods other than frying. This may include baking, grilling, or broiling. Lifestyle    Maintain a healthy weight. Ask your health care provider what weight is healthy for you. If you need to lose weight, work with your health care provider to do so safely.  Exercise for at least 30 minutes on 5 or more days each week, or as told by your health care provider.  Avoid wearing clothes that fit tightly around your waist and chest.  Do not use any products that contain nicotine or tobacco, such as cigarettes and e-cigarettes. If you need help quitting, ask  your health care provider.  Sleep with the head of your bed raised. Use a wedge under the mattress or blocks under the bed frame to raise the head of the bed. What foods are not recommended? The items listed may not be a complete list. Talk with your dietitian about what dietary choices are best for you. Grains  Pastries or quick breads with added fat. [place] toast. Vegetables  Deep fried vegetables. [place] fries. Any vegetables prepared with added fat. Any vegetables that cause symptoms. For some people this may include tomatoes and tomato products, chili peppers, onions and garlic, and horseradish. Fruits  Any fruits prepared with added fat. Any fruits that cause symptoms. For some people this may include citrus fruits, such as oranges, grapefruit, pineapple, and lemons. Meats and other protein foods  High-fat meats, such as fatty beef or pork, hot dogs, ribs, ham, sausage, salami and bacon. Fried meat or protein, including fried fish and fried chicken. Nuts and nut butters. Dairy  Whole milk and chocolate milk. Sour cream. Cream. Ice cream. Cream cheese. Milk shakes. Beverages  Coffee and tea, with or without caffeine. Carbonated beverages. Sodas. Energy drinks. Fruit juice made with acidic fruits (such as orange or grapefruit). Tomato juice. Alcoholic drinks. Fats and oils  Butter. Margarine. Shortening. Ghee. Sweets and desserts  Chocolate and cocoa. Donuts. Seasoning and other foods  Pepper. Peppermint and spearmint. Any condiments, herbs, or seasonings that cause symptoms. For some people, this may include curry, hot sauce, or vinegar-based salad dressings. Summary  When you  have gastroesophageal reflux disease (GERD), food and lifestyle choices are very important to help ease the discomfort of GERD.  Eat frequent, small meals instead of three large meals each day. Eat your meals slowly, in a relaxed setting. Avoid bending over or lying down until 2-3 hours after eating.  Limit  high-fat foods such as fatty meat or fried foods. This information is not intended to replace advice given to you by your health care provider. Make sure you discuss any questions you have with your health care provider. Document Released: 09/26/2005 Document Revised: 09/27/2016 Document Reviewed: 09/27/2016 Elsevier Interactive Patient Education  2017 [employer]

## 2016-12-26 NOTE — Progress Notes (Signed)
CC'D TO PCP °

## 2016-12-26 NOTE — Assessment & Plan Note (Signed)
Dyspepsia do to GERD and associated nausea/vomiting. Symptoms are improved but not resolved. At this time I will increase his Prilosec 20 mg twice a day. Diet recommendations will be provided in his discharge instructions for trigger avoidance. Return for follow-up in 3 months, or sooner if needed.

## 2016-12-26 NOTE — Progress Notes (Signed)
Referring Provider: Kirstie Peri, MD Primary Care Physician:  Kirstie Peri, MD Primary GI:  Dr. Jena Gauss  Chief Complaint  Patient presents with  . Nausea  . Emesis    HPI:   Tom Russell is a 46 y.o. male who presents for follow-up on nausea, vomiting, dyspepsia. He was last seen in our office to 02/26/2017 at which point he was noted to be a limited historian and accompanied by group home staff member no some clinical information about him. His symptoms began a couple months prior, dietary changes without success. Vomiting intermittent without hematemesis. Some beforehand nausea and staff will notice some gagging prior to emesis. Occasional esophageal burning and dyspepsia symptoms associated with his nausea/vomiting. Denies constipation. No other GI symptoms.  He was started on Prilosec 20 mg once a day and Zofran 8 mg every 8 hours as needed. Return for follow-up in 6 weeks.  Today he is accompanied by facility staff. Per patient and staff, patient vomiting is significantly improved. Some continued nausea.Denies hematemesis. GERD/dyspepsia symptoms resolved (per the patient): Denies "hot acid burning in the throat." Denies hematochezia and melena. Some epigastric tenderness. Staff notes certain foods tend to cause symptoms, especially tomatoes.   Past Medical History:  Diagnosis Date  . Cerebral palsy (HCC)   . Mental retardation   . Seizures (HCC)     Past Surgical History:  Procedure Laterality Date  . None to date     as of 11/14/16    Current Outpatient Prescriptions  Medication Sig Dispense Refill  . ARIPiprazole (ABILIFY) 5 MG tablet Take 5 mg by mouth every morning.    . carbamazepine (CARBATROL) 300 MG 12 hr capsule Take by mouth. Takes 2 capsules in the am, 1 capsule at noon, and 2 capsules at bedtime    . diazepam (VALIUM) 5 MG tablet Take 5 mg by mouth 2 (two) times daily.    . fluvoxaMINE (LUVOX) 100 MG tablet Take 100 mg by mouth 2 (two) times daily.    Marland Kitchen  gabapentin (NEURONTIN) 800 MG tablet Take 800 mg by mouth 4 (four) times daily.    Marland Kitchen omeprazole (PRILOSEC) 20 MG capsule Take 1 capsule (20 mg total) by mouth daily. 90 capsule 3  . zolpidem (AMBIEN) 10 MG tablet Take 10 mg by mouth at bedtime.     No current facility-administered medications for this visit.     Allergies as of 12/26/2016  . (No Known Allergies)    No family history on file.  Social History   Social History  . Marital status: Single    Spouse name: N/A  . Number of children: N/A  . Years of education: N/A   Social History Main Topics  . Smoking status: Never Smoker  . Smokeless tobacco: Never Used  . Alcohol use No  . Drug use: No  . Sexual activity: Not Asked   Other Topics Concern  . None   Social History Narrative  . None    Review of Systems: General: Negative for anorexia, weight loss, fever, chills, fatigue, weakness. ENT: Negative for hoarseness, difficulty swallowing. CV: Negative for chest pain, angina, palpitations, peripheral edema.  Respiratory: Negative for dyspnea at rest, cough, sputum, wheezing.  GI: See history of present illness. Endo: Negative for unusual weight change.  Heme: Negative for bruising or bleeding. Allergy: Negative for rash or hives.   Physical Exam: BP 137/86   Pulse 79   Temp 97.8 F (36.6 C) (Oral)   Ht 5\' 6"  (1.676 m)  General:   Alert and oriented. Pleasant and cooperative. Well-nourished and well-developed.  Eyes:  Without icterus, sclera clear and conjunctiva pink. Eyes seem to be crossed Ears:  Normal auditory acuity. Cardiovascular:  S1, S2 present without murmurs appreciated. Extremities without clubbing or edema. Respiratory:  Clear to auscultation bilaterally. No wheezes, rales, or rhonchi. No distress.  Gastrointestinal:  +BS, soft, and non-distended. Mild TTP epigastric area. No HSM noted. No guarding or rebound. No masses appreciated.  Rectal:  Deferred  Musculoskalatal:  Sitting in  wheelchair. Psych:  Alert and cooperative. Normal mood and affect. Heme/Lymph/Immune: No excessive bruising noted.    12/26/2016 9:52 AM   Disclaimer: This note was dictated with voice recognition software. Similar sounding words can inadvertently be transcribed and may not be corrected upon review.

## 2017-01-19 DIAGNOSIS — F71 Moderate intellectual disabilities: Secondary | ICD-10-CM | POA: Diagnosis not present

## 2017-02-01 DIAGNOSIS — R269 Unspecified abnormalities of gait and mobility: Secondary | ICD-10-CM | POA: Diagnosis not present

## 2017-02-01 DIAGNOSIS — E785 Hyperlipidemia, unspecified: Secondary | ICD-10-CM | POA: Diagnosis not present

## 2017-02-01 DIAGNOSIS — F79 Unspecified intellectual disabilities: Secondary | ICD-10-CM | POA: Diagnosis not present

## 2017-02-01 DIAGNOSIS — Z713 Dietary counseling and surveillance: Secondary | ICD-10-CM | POA: Diagnosis not present

## 2017-02-01 DIAGNOSIS — R569 Unspecified convulsions: Secondary | ICD-10-CM | POA: Diagnosis not present

## 2017-02-01 DIAGNOSIS — G809 Cerebral palsy, unspecified: Secondary | ICD-10-CM | POA: Diagnosis not present

## 2017-02-01 DIAGNOSIS — Z299 Encounter for prophylactic measures, unspecified: Secondary | ICD-10-CM | POA: Diagnosis not present

## 2017-02-01 DIAGNOSIS — Z6824 Body mass index (BMI) 24.0-24.9, adult: Secondary | ICD-10-CM | POA: Diagnosis not present

## 2017-03-28 ENCOUNTER — Ambulatory Visit: Payer: Medicare Other | Admitting: Nurse Practitioner

## 2017-05-10 DIAGNOSIS — Z299 Encounter for prophylactic measures, unspecified: Secondary | ICD-10-CM | POA: Diagnosis not present

## 2017-05-10 DIAGNOSIS — E785 Hyperlipidemia, unspecified: Secondary | ICD-10-CM | POA: Diagnosis not present

## 2017-05-10 DIAGNOSIS — F79 Unspecified intellectual disabilities: Secondary | ICD-10-CM | POA: Diagnosis not present

## 2017-05-10 DIAGNOSIS — Z6824 Body mass index (BMI) 24.0-24.9, adult: Secondary | ICD-10-CM | POA: Diagnosis not present

## 2017-05-10 DIAGNOSIS — R569 Unspecified convulsions: Secondary | ICD-10-CM | POA: Diagnosis not present

## 2017-05-10 DIAGNOSIS — G809 Cerebral palsy, unspecified: Secondary | ICD-10-CM | POA: Diagnosis not present

## 2017-05-18 DIAGNOSIS — F71 Moderate intellectual disabilities: Secondary | ICD-10-CM | POA: Diagnosis not present

## 2017-05-22 ENCOUNTER — Ambulatory Visit: Payer: Medicare Other | Admitting: Gastroenterology

## 2017-05-22 ENCOUNTER — Ambulatory Visit: Payer: Medicare Other | Admitting: Nurse Practitioner

## 2017-07-07 ENCOUNTER — Ambulatory Visit: Payer: Medicare Other | Admitting: Gastroenterology

## 2017-07-07 ENCOUNTER — Telehealth: Payer: Self-pay | Admitting: Gastroenterology

## 2017-07-07 NOTE — Telephone Encounter (Signed)
Letter mailed

## 2017-07-07 NOTE — Telephone Encounter (Signed)
Pt was a no show

## 2017-08-18 DIAGNOSIS — F71 Moderate intellectual disabilities: Secondary | ICD-10-CM | POA: Diagnosis not present

## 2017-08-24 DIAGNOSIS — Z0000 Encounter for general adult medical examination without abnormal findings: Secondary | ICD-10-CM | POA: Diagnosis not present

## 2017-08-24 DIAGNOSIS — Z6824 Body mass index (BMI) 24.0-24.9, adult: Secondary | ICD-10-CM | POA: Diagnosis not present

## 2017-08-24 DIAGNOSIS — Z1339 Encounter for screening examination for other mental health and behavioral disorders: Secondary | ICD-10-CM | POA: Diagnosis not present

## 2017-08-24 DIAGNOSIS — Z299 Encounter for prophylactic measures, unspecified: Secondary | ICD-10-CM | POA: Diagnosis not present

## 2017-08-24 DIAGNOSIS — R05 Cough: Secondary | ICD-10-CM | POA: Diagnosis not present

## 2017-08-24 DIAGNOSIS — F79 Unspecified intellectual disabilities: Secondary | ICD-10-CM | POA: Diagnosis not present

## 2017-08-24 DIAGNOSIS — Z713 Dietary counseling and surveillance: Secondary | ICD-10-CM | POA: Diagnosis not present

## 2017-08-24 DIAGNOSIS — E785 Hyperlipidemia, unspecified: Secondary | ICD-10-CM | POA: Diagnosis not present

## 2017-08-24 DIAGNOSIS — Z79899 Other long term (current) drug therapy: Secondary | ICD-10-CM | POA: Diagnosis not present

## 2017-08-24 DIAGNOSIS — Z1331 Encounter for screening for depression: Secondary | ICD-10-CM | POA: Diagnosis not present

## 2017-08-24 DIAGNOSIS — R5383 Other fatigue: Secondary | ICD-10-CM | POA: Diagnosis not present

## 2017-09-08 ENCOUNTER — Ambulatory Visit (INDEPENDENT_AMBULATORY_CARE_PROVIDER_SITE_OTHER): Payer: Medicare Other | Admitting: Gastroenterology

## 2017-09-08 ENCOUNTER — Encounter: Payer: Self-pay | Admitting: Gastroenterology

## 2017-09-08 ENCOUNTER — Other Ambulatory Visit: Payer: Self-pay

## 2017-09-08 VITALS — BP 132/79 | HR 77 | Temp 97.1°F

## 2017-09-08 DIAGNOSIS — R112 Nausea with vomiting, unspecified: Secondary | ICD-10-CM

## 2017-09-08 NOTE — Assessment & Plan Note (Signed)
46 year old gentleman with history of cerebral palsy/mental deficits who presents for follow-up of persisting nausea/vomiting.  Seen initially in February of this year.  Was started on PPI.  Several weeks later was doing better but PPI increased to twice daily.  Continues to have vomiting at least 3-4 days/week.  Patient unable to provide detailed history but denies abdominal pain.  At this point would offer an upper endoscopy for further evaluation of chronic nausea/vomiting.  Plan for deep sedation in the OR.  I have discussed the risks, alternatives, benefits with regards to but not limited to the risk of reaction to medication, bleeding, infection, perforation and the patient is agreeable to proceed. Written consent to be obtained.

## 2017-09-08 NOTE — Progress Notes (Signed)
Primary Care Physician: Kirstie PeriShah, Ashish, MD  Primary Gastroenterologist:  Roetta SessionsMichael Rourk, MD   Chief Complaint  Patient presents with  . Gastroesophageal Reflux    C/o vomiting w/ certain foods    HPI: Tom Russell is a 46 y.o. male here for follow-up of nausea/vomiting.  Initially seen back in February was started on Prilosec at that time.  Noted improvement in symptoms at follow-up visit.  Prilosec to increase to twice daily.  He presents for follow-up.  Patient able to provide limited history but someone from the group home presents with him is able to help.  He apparently continues to have postprandial vomiting about 3-4 times per week.  It did not improve with dairy avoidance or avoidance of tomato based foods. Vomited this morning after eating grits.  Patient denies dysphagia.  Denies abdominal pain.  Apparently his bowel movements are regular.  There has been no reported melena or rectal bleeding. We are unable to weigh patient but no apparent significant weight loss per staff.   Current Outpatient Medications  Medication Sig Dispense Refill  . ARIPiprazole (ABILIFY) 5 MG tablet Take 5 mg by mouth every morning.    . carbamazepine (CARBATROL) 300 MG 12 hr capsule Take by mouth. Takes 2 capsules in the am, 1 capsule at noon, and 2 capsules at bedtime    . diazepam (VALIUM) 5 MG tablet Take 5 mg by mouth 2 (two) times daily.    . fluvoxaMINE (LUVOX) 100 MG tablet Take 100 mg by mouth 2 (two) times daily.    Marland Kitchen. gabapentin (NEURONTIN) 800 MG tablet Take 800 mg by mouth 4 (four) times daily.    Marland Kitchen. omeprazole (PRILOSEC) 20 MG capsule Take 1 capsule (20 mg total) by mouth 2 (two) times daily before a meal. 180 capsule 3  . zolpidem (AMBIEN) 10 MG tablet Take 10 mg by mouth at bedtime.     No current facility-administered medications for this visit.     Allergies as of 09/08/2017  . (No Known Allergies)   Past Medical History:  Diagnosis Date  . Cerebral palsy (HCC)   . Mental  retardation   . Seizures (HCC)    Past Surgical History:  Procedure Laterality Date  . None to date     as of 11/14/16   Family History  Family history unknown: Yes     Social History   Socioeconomic History  . Marital status: Single    Spouse name: None  . Number of children: None  . Years of education: None  . Highest education level: None  Social Needs  . Financial resource strain: None  . Food insecurity - worry: None  . Food insecurity - inability: None  . Transportation needs - medical: None  . Transportation needs - non-medical: None  Occupational History  . None  Tobacco Use  . Smoking status: Never Smoker  . Smokeless tobacco: Never Used  Substance and Sexual Activity  . Alcohol use: No  . Drug use: No  . Sexual activity: None  Other Topics Concern  . None  Social History Narrative  . None    ROS: Limited history available from patient. See hpi.     Physical Examination:   BP 132/79   Pulse 77   Temp (!) 97.1 F (36.2 C) (Oral)   General: Well-nourished, well-developed in no acute distress.  Eyes: No icterus. Mouth: Oropharyngeal mucosa moist and pink , no lesions erythema or exudate. Lungs: Clear to auscultation  bilaterally.  Heart: Regular rate and rhythm, no murmurs rubs or gallops.  Abdomen: Bowel sounds are normal, nontender, nondistended, no hepatosplenomegaly or masses, no abdominal bruits or hernia , no rebound or guarding.   Extremities: No lower extremity edema. No clubbing or deformities. Neuro: Alert and oriented x 4   Skin: Warm and dry, no jaundice.   Psych: Alert and cooperative, normal mood and affect.

## 2017-09-08 NOTE — Patient Instructions (Signed)
1. Please send a copy of his medication list. What is printed out today is what we had on file from previous visits.  2. Upper endoscopy as scheduled. See separate instructions.

## 2017-09-11 NOTE — Progress Notes (Signed)
cc'ed to pcp °

## 2017-10-11 NOTE — Patient Instructions (Addendum)
Tom Russell  10/11/2017     @PREFPERIOPPHARMACY @   Your procedure is scheduled on 10/23/2017.  Report to [name] at 6:15 A.M.  Call this number if you have problems the morning of surgery:  [phone]   Remember:  Do not eat food or drink liquids after midnight.  Take these medicines the morning of surgery with A SIP OF WATER Abilify, Carbatrol, Valium, Lovox, Prilosec   Do not wear jewelry, make-up or nail polish.  Do not wear lotions, powders, or perfumes, or deodorant.  Do not shave 48 hours prior to surgery.  Men may shave face and neck.  Do not bring valuables to the hospital.  [facility] is not responsible for any belongings or valuables.  Contacts, dentures or bridgework may not be worn into surgery.  Leave your suitcase in the car.  After surgery it may be brought to your room.  For patients admitted to the hospital, discharge time will be determined by your treatment team.  Patients discharged the day of surgery will not be allowed to drive home.    Please read over the following fact sheets that you were given. Anesthesia Post-op Instructions     PATIENT INSTRUCTIONS POST-ANESTHESIA  IMMEDIATELY FOLLOWING SURGERY:  Do not drive or operate machinery for the first twenty four hours after surgery.  Do not make any important decisions for twenty four hours after surgery or while taking narcotic pain medications or sedatives.  If you develop intractable nausea and vomiting or a severe headache please notify your doctor immediately.  FOLLOW-UP:  Please make an appointment with your surgeon as instructed. You do not need to follow up with anesthesia unless specifically instructed to do so.  WOUND CARE INSTRUCTIONS (if applicable):  Keep a dry clean dressing on the anesthesia/puncture wound site if there is drainage.  Once the wound has quit draining you may leave it open to air.  Generally you should leave the bandage intact for twenty four hours unless there is  drainage.  If the epidural site drains for more than 36-48 hours please call the anesthesia department.  QUESTIONS?:  Please feel free to call your physician or the hospital operator if you have any questions, and they will be happy to assist you.      Esophagogastroduodenoscopy Esophagogastroduodenoscopy (EGD) is a procedure to examine the lining of the esophagus, stomach, and first part of the small intestine (duodenum). This procedure is done to check for problems such as inflammation, bleeding, ulcers, or growths. During this procedure, a long, flexible, lighted tube with a camera attached (endoscope) is inserted down the throat. Tell a health care provider about:  Any allergies you have.  All medicines you are taking, including vitamins, herbs, eye drops, creams, and over-the-counter medicines.  Any problems you or family members have had with anesthetic medicines.  Any blood disorders you have.  Any surgeries you have had.  Any medical conditions you have.  Whether you are pregnant or may be pregnant. What are the risks? Generally, this is a safe procedure. However, problems may occur, including:  Infection.  Bleeding.  A tear (perforation) in the esophagus, stomach, or duodenum.  Trouble breathing.  Excessive sweating.  Spasms of the larynx.  A slowed heartbeat.  Low blood pressure.  What happens before the procedure?  Follow instructions from your health care provider about eating or drinking restrictions.  Ask your health care provider about: ? Changing or stopping your regular medicines. This is especially important if  you are taking diabetes medicines or blood thinners. ? Taking medicines such as aspirin and ibuprofen. These medicines can thin your blood. Do not take these medicines before your procedure if your health care provider instructs you not to.  Plan to have someone take you home after the procedure.  If you wear dentures, be ready to remove  them before the procedure. What happens during the procedure?  To reduce your risk of infection, your health care team will wash or sanitize their hands.  An IV tube will be put in a vein in your hand or arm. You will get medicines and fluids through this tube.  You will be given one or more of the following: ? A medicine to help you relax (sedative). ? A medicine to numb the area (local anesthetic). This medicine may be sprayed into your throat. It will make you feel more comfortable and keep you from gagging or coughing during the procedure. ? A medicine for pain.  A mouth guard may be placed in your mouth to protect your teeth and to keep you from biting on the endoscope.  You will be asked to lie on your left side.  The endoscope will be lowered down your throat into your esophagus, stomach, and duodenum.  Air will be put into the endoscope. This will help your health care provider see better.  The lining of your esophagus, stomach, and duodenum will be examined.  Your health care provider may: ? Take a tissue sample so it can be looked at in a lab (biopsy). ? Remove growths. ? Remove objects (foreign bodies) that are stuck. ? Treat any bleeding with medicines or other devices that stop tissue from bleeding. ? Widen (dilate) or stretch narrowed areas of your esophagus and stomach.  The endoscope will be taken out. The procedure may vary among health care providers and hospitals. What happens after the procedure?  Your blood pressure, heart rate, breathing rate, and blood oxygen level will be monitored often until the medicines you were given have worn off.  Do not eat or drink anything until the numbing medicine has worn off and your gag reflex has returned. This information is not intended to replace advice given to you by your health care provider. Make sure you discuss any questions you have with your health care provider. Document Released: 01/27/2005 Document Revised:  03/03/2016 Document Reviewed: 08/20/2015 Elsevier Interactive Patient Education  2018 Elsevier Inc.    Monitored Anesthesia Care, Care After These instructions provide you with information about caring for yourself after your procedure. Your health care provider may also give you more specific instructions. Your treatment has been planned according to current medical practices, but problems sometimes occur. Call your health care provider if you have any problems or questions after your procedure. What can I expect after the procedure? After your procedure, it is common to:  Feel sleepy for several hours.  Feel clumsy and have poor balance for several hours.  Feel forgetful about what happened after the procedure.  Have poor judgment for several hours.  Feel nauseous or vomit.  Have a sore throat if you had a breathing tube during the procedure.  Follow these instructions at home: For at least 24 hours after the procedure:   Do not: ? Participate in activities in which you could fall or become injured. ? Drive. ? Use heavy machinery. ? Drink alcohol. ? Take sleeping pills or medicines that cause drowsiness. ? Make important decisions or sign legal documents. ?  Take care of children on your own.  Rest. Eating and drinking  Follow the diet that is recommended by your health care provider.  If you vomit, drink water, juice, or soup when you can drink without vomiting.  Make sure you have little or no nausea before eating solid foods. General instructions  Have a responsible adult stay with you until you are awake and alert.  Take over-the-counter and prescription medicines only as told by your health care provider.  If you smoke, do not smoke without supervision.  Keep all follow-up visits as told by your health care provider. This is important. Contact a health care provider if:  You keep feeling nauseous or you keep vomiting.  You feel light-headed.  You  develop a rash.  You have a fever. Get help right away if:  You have trouble breathing. This information is not intended to replace advice given to you by your health care provider. Make sure you discuss any questions you have with your health care provider. Document Released: 01/17/2016 Document Revised: 05/18/2016 Document Reviewed: 01/17/2016 Elsevier Interactive Patient Education  [employer]2018 Elsevier Inc.

## 2017-10-16 ENCOUNTER — Encounter (HOSPITAL_COMMUNITY): Payer: Self-pay

## 2017-10-16 ENCOUNTER — Encounter (HOSPITAL_COMMUNITY)
Admission: RE | Admit: 2017-10-16 | Discharge: 2017-10-16 | Disposition: A | Discharge status: Home / Self Care | Payer: Medicare Other | Source: Ambulatory Visit | Attending: Internal Medicine | Admitting: Internal Medicine

## 2017-10-16 ENCOUNTER — Other Ambulatory Visit: Payer: Self-pay

## 2017-10-16 DIAGNOSIS — Z01818 Encounter for other preprocedural examination: Secondary | ICD-10-CM | POA: Insufficient documentation

## 2017-10-16 HISTORY — DX: Gastro-esophageal reflux disease without esophagitis: K21.9

## 2017-10-16 HISTORY — DX: Depression, unspecified: F32.A

## 2017-10-16 HISTORY — DX: Major depressive disorder, single episode, unspecified: F32.9

## 2017-10-16 HISTORY — DX: Anxiety disorder, unspecified: F41.9

## 2017-10-16 LAB — CBC WITH DIFFERENTIAL/PLATELET
Basophils Absolute: 0 10*3/uL (ref 0.0–0.1)
Basophils Relative: 0 %
EOS ABS: 0 10*3/uL (ref 0.0–0.7)
Eosinophils Relative: 0 %
HEMATOCRIT: 38.9 % — AB (ref 39.0–52.0)
HEMOGLOBIN: 14.2 g/dL (ref 13.0–17.0)
LYMPHS ABS: 1.2 10*3/uL (ref 0.7–4.0)
LYMPHS PCT: 28 %
MCH: 33.6 pg (ref 26.0–34.0)
MCHC: 36.5 g/dL — AB (ref 30.0–36.0)
MCV: 92.2 fL (ref 78.0–100.0)
MONOS PCT: 11 %
Monocytes Absolute: 0.5 10*3/uL (ref 0.1–1.0)
NEUTROS ABS: 2.7 10*3/uL (ref 1.7–7.7)
NEUTROS PCT: 61 %
Platelets: 174 10*3/uL (ref 150–400)
RBC: 4.22 MIL/uL (ref 4.22–5.81)
RDW: 12.1 % (ref 11.5–15.5)
WBC: 4.4 10*3/uL (ref 4.0–10.5)

## 2017-10-16 LAB — BASIC METABOLIC PANEL
Anion gap: 11 (ref 5–15)
BUN: 7 mg/dL (ref 6–20)
CHLORIDE: 86 mmol/L — AB (ref 101–111)
CO2: 27 mmol/L (ref 22–32)
Calcium: 9.1 mg/dL (ref 8.9–10.3)
Creatinine, Ser: 0.43 mg/dL — ABNORMAL LOW (ref 0.61–1.24)
GFR calc Af Amer: >60 mL/min (ref >60)
GFR calc non Af Amer: >60 mL/min (ref >60)
Glucose, Bld: 85 mg/dL (ref 65–99)
POTASSIUM: 3.6 mmol/L (ref 3.5–5.1)
SODIUM: 124 mmol/L — AB (ref 135–145)

## 2017-10-17 NOTE — Progress Notes (Signed)
Pre-op labs. EGD on 10/23/17.  Please get updated med list, the group home never sent one after OV like I requested.  Please have the PCP address hyponatremia ASAP.   FYI for Dr. Jena Gaussourk. Also, any urgency ie can he have EGD anyway?

## 2017-10-19 ENCOUNTER — Encounter: Payer: Self-pay | Admitting: *Deleted

## 2017-10-19 ENCOUNTER — Telehealth: Payer: Self-pay | Admitting: *Deleted

## 2017-10-19 ENCOUNTER — Other Ambulatory Visit: Payer: Self-pay

## 2017-10-19 NOTE — Telephone Encounter (Signed)
Tom Russell called back and stated they will do a telephone call to pt on 11/16/17.  Called Tom Russell and made aware. Also aware new instructions have been mailed. Nothing further needed

## 2017-10-19 NOTE — Telephone Encounter (Signed)
Tom Russell from pt group home is wanting to r/s EGD with propofol scheduled for 10/23/17 at 8:15am d/t the possible weather coming. Pt rescheduled to 11/23/17 at 7:30am. New instructions mailed. Called carolyn and LMOVM. Pt already had PRE-OP appt. Does he need another one or can this be done via telephone call? LMOVM for [employer].  Tom Russell can be reached at [phone]

## 2017-10-25 NOTE — Progress Notes (Signed)
Can we get Tom Russell's help on this? I see patient has now been moved to EGD and it is even more important to update that med list. Thanks!

## 2017-10-26 NOTE — Progress Notes (Signed)
[facility] however I spoke with Kara MeadEmma, med tech at the [place] and she is faxing over the [employer] list.   Med list placed on AssurantLeslie's desk.

## 2017-10-27 NOTE — Progress Notes (Signed)
Received medication list finally from group home.  Medications were correct as listed.

## 2017-10-27 NOTE — Progress Notes (Signed)
Med list received and was correct in epic

## 2017-11-07 ENCOUNTER — Telehealth: Payer: Self-pay

## 2017-11-07 NOTE — Telephone Encounter (Signed)
PA started for pt through [email or web address] Omeprazole. Waiting on approval or denial.

## 2017-11-07 NOTE — Telephone Encounter (Signed)
Received approval letter for Omeprazole 20mg . Approval is good for 10/10/17-10/09/18. Letter will be scanned in chart.

## 2017-11-16 ENCOUNTER — Encounter (HOSPITAL_COMMUNITY): Payer: Self-pay

## 2017-11-16 ENCOUNTER — Encounter (HOSPITAL_COMMUNITY)
Admission: RE | Admit: 2017-11-16 | Discharge: 2017-11-16 | Disposition: A | Discharge status: Home / Self Care | Payer: Medicare Other | Source: Ambulatory Visit | Attending: Internal Medicine | Admitting: Internal Medicine

## 2017-11-16 NOTE — Pre-Procedure Instructions (Signed)
Patient a resident at [facility]. Spoke with manager, Consuella LoseElaine to go over preop information. She states that patient's sister is his legal guardian and will be with him the morning of his procedure to sign consent.

## 2017-11-20 ENCOUNTER — Other Ambulatory Visit: Payer: Self-pay | Admitting: Nurse Practitioner

## 2017-11-20 DIAGNOSIS — R112 Nausea with vomiting, unspecified: Secondary | ICD-10-CM

## 2017-11-20 DIAGNOSIS — R1013 Epigastric pain: Secondary | ICD-10-CM

## 2017-11-23 ENCOUNTER — Ambulatory Visit (HOSPITAL_COMMUNITY): Payer: Medicare Other | Admitting: Anesthesiology

## 2017-11-23 ENCOUNTER — Ambulatory Visit (HOSPITAL_COMMUNITY)
Admission: RE | Admit: 2017-11-23 | Discharge: 2017-11-23 | Disposition: A | Discharge status: Home / Self Care | Payer: Medicare Other | Source: Ambulatory Visit | Attending: Internal Medicine | Admitting: Internal Medicine

## 2017-11-23 ENCOUNTER — Encounter (HOSPITAL_COMMUNITY): Payer: Self-pay | Admitting: *Deleted

## 2017-11-23 ENCOUNTER — Encounter (HOSPITAL_COMMUNITY)
Admission: RE | Disposition: A | Discharge status: Home / Self Care | Payer: Self-pay | Source: Ambulatory Visit | Attending: Internal Medicine

## 2017-11-23 DIAGNOSIS — K30 Functional dyspepsia: Secondary | ICD-10-CM | POA: Diagnosis not present

## 2017-11-23 DIAGNOSIS — K317 Polyp of stomach and duodenum: Secondary | ICD-10-CM | POA: Diagnosis not present

## 2017-11-23 DIAGNOSIS — G809 Cerebral palsy, unspecified: Secondary | ICD-10-CM | POA: Diagnosis not present

## 2017-11-23 DIAGNOSIS — F419 Anxiety disorder, unspecified: Secondary | ICD-10-CM | POA: Insufficient documentation

## 2017-11-23 DIAGNOSIS — K219 Gastro-esophageal reflux disease without esophagitis: Secondary | ICD-10-CM | POA: Insufficient documentation

## 2017-11-23 DIAGNOSIS — R112 Nausea with vomiting, unspecified: Secondary | ICD-10-CM | POA: Diagnosis not present

## 2017-11-23 DIAGNOSIS — F329 Major depressive disorder, single episode, unspecified: Secondary | ICD-10-CM | POA: Diagnosis not present

## 2017-11-23 DIAGNOSIS — Z79899 Other long term (current) drug therapy: Secondary | ICD-10-CM | POA: Diagnosis not present

## 2017-11-23 DIAGNOSIS — F79 Unspecified intellectual disabilities: Secondary | ICD-10-CM | POA: Insufficient documentation

## 2017-11-23 HISTORY — PX: ESOPHAGOGASTRODUODENOSCOPY (EGD) WITH PROPOFOL: SHX5813

## 2017-11-23 SURGERY — ESOPHAGOGASTRODUODENOSCOPY (EGD) WITH PROPOFOL
Anesthesia: Monitor Anesthesia Care

## 2017-11-23 MED ORDER — LIDOCAINE VISCOUS 2 % MT SOLN
6.0000 mL | Freq: Once | OROMUCOSAL | Status: AC
  Administered 2017-11-23: 6 mL via OROMUCOSAL

## 2017-11-23 MED ORDER — LIDOCAINE HCL (PF) 1 % IJ SOLN
INTRAMUSCULAR | Status: AC
  Filled 2017-11-23: qty 5

## 2017-11-23 MED ORDER — ONDANSETRON HCL 4 MG/2ML IJ SOLN
4.0000 mg | Freq: Once | INTRAMUSCULAR | Status: AC
  Administered 2017-11-23: 4 mg via INTRAVENOUS

## 2017-11-23 MED ORDER — PROPOFOL 10 MG/ML IV BOLUS
INTRAVENOUS | Status: AC
  Filled 2017-11-23: qty 100

## 2017-11-23 MED ORDER — CHLORHEXIDINE GLUCONATE CLOTH 2 % EX PADS: 6.0000 | MEDICATED_PAD | Freq: Once | CUTANEOUS | Status: DC

## 2017-11-23 MED ORDER — ONDANSETRON HCL 4 MG/2ML IJ SOLN
INTRAMUSCULAR | Status: AC
  Filled 2017-11-23: qty 2

## 2017-11-23 MED ORDER — FENTANYL CITRATE (PF) 100 MCG/2ML IJ SOLN
INTRAMUSCULAR | Status: AC
  Filled 2017-11-23: qty 2

## 2017-11-23 MED ORDER — PROPOFOL 10 MG/ML IV BOLUS
INTRAVENOUS | Status: AC
  Filled 2017-11-23: qty 60

## 2017-11-23 MED ORDER — FENTANYL CITRATE (PF) 100 MCG/2ML IJ SOLN
25.0000 ug | Freq: Once | INTRAMUSCULAR | Status: AC
  Administered 2017-11-23: 25 ug via INTRAVENOUS

## 2017-11-23 MED ORDER — LIDOCAINE VISCOUS 2 % MT SOLN
OROMUCOSAL | Status: AC
  Filled 2017-11-23: qty 15

## 2017-11-23 MED ORDER — STERILE WATER FOR IRRIGATION IR SOLN
Status: DC | PRN
  Administered 2017-11-23: 08:00:00

## 2017-11-23 MED ORDER — MIDAZOLAM HCL 2 MG/2ML IJ SOLN
1.0000 mg | INTRAMUSCULAR | Status: AC
  Administered 2017-11-23: 2 mg via INTRAVENOUS

## 2017-11-23 MED ORDER — LACTATED RINGERS IV SOLN
INTRAVENOUS | Status: DC
  Administered 2017-11-23: 07:00:00 via INTRAVENOUS

## 2017-11-23 MED ORDER — MIDAZOLAM HCL 2 MG/2ML IJ SOLN
INTRAMUSCULAR | Status: AC
  Filled 2017-11-23: qty 2

## 2017-11-23 MED ORDER — PROPOFOL 500 MG/50ML IV EMUL
INTRAVENOUS | Status: DC | PRN
  Administered 2017-11-23: 150 ug/kg/min via INTRAVENOUS

## 2017-11-23 MED ORDER — SUCCINYLCHOLINE CHLORIDE 20 MG/ML IJ SOLN
INTRAMUSCULAR | Status: AC
  Filled 2017-11-23: qty 1

## 2017-11-23 NOTE — H&P (Signed)
 @LOGO @   Primary Care Physician:  Kirstie PeriShah, Ashish, MD Primary Gastroenterologist:  Dr. Jena Gaussourk  Pre-Procedure History & Physical: HPI:  Tom Russell is a 47 y.o. male here for intermittent nausea and vomiting. Here for EGD per plan.  Past Medical History:  Diagnosis Date  . Anxiety   . Cerebral palsy (HCC)   . Depression   . GERD (gastroesophageal reflux disease)   . Mental retardation   . Seizures (HCC)     Past Surgical History:  Procedure Laterality Date  . None to date     as of 11/14/16    Prior to Admission medications   Medication Sig Start Date End Date Taking? Authorizing Provider  ARIPiprazole (ABILIFY) 5 MG tablet Take 5 mg by mouth every morning.   Yes [provider]  carbamazepine (CARBATROL) 300 MG 12 hr capsule Take by mouth. Takes 2 capsules in the am, 1 capsule at noon, and 2 capsules at bedtime   Yes [provider]  diazepam (VALIUM) 5 MG tablet Take 5 mg by mouth 2 (two) times daily.   Yes [provider]  fluvoxaMINE (LUVOX) 100 MG tablet Take 100 mg by mouth 2 (two) times daily.   Yes [provider]  gabapentin (NEURONTIN) 800 MG tablet Take 800 mg by mouth 4 (four) times daily.   Yes [provider]  omeprazole (PRILOSEC) 20 MG capsule TAKE 1 CAPSULE BY MOUTH TWICE DAILY BEFORE A MEAL 11/20/17  Yes Gelene MinkBoone, Anna W, NP  zolpidem (AMBIEN) 10 MG tablet Take 10 mg by mouth at bedtime.   Yes [provider]    Allergies as of 09/08/2017  . (No Known Allergies)    Family History  Family history unknown: Yes    Social History   Socioeconomic History  . Marital status: Single    Spouse name: Not on file  . Number of children: Not on file  . Years of education: Not on file  . Highest education level: Not on file  Social Needs  . Financial resource strain: Not on file  . Food insecurity - worry: Not on file  . Food insecurity - inability: Not on file  . Transportation needs - medical: Not on file  .  Transportation needs - non-medical: Not on file  Occupational History  . Not on file  Tobacco Use  . Smoking status: Never Smoker  . Smokeless tobacco: Never Used  Substance and Sexual Activity  . Alcohol use: No  . Drug use: No  . Sexual activity: No    Birth control/protection: None  Other Topics Concern  . Not on file  Social History Narrative  . Not on file    Review of Systems: See HPI, otherwise negative ROS  Physical Exam: Temp 98.6 F (37 C) (Oral)  General:   Alert,  nonverbal. pleasant and cooperative in NAD Skin:  Intact without significant lesions or rashes. Neck:  Supple; no masses or thyromegaly. No significant cervical adenopathy. Lungs:  Clear throughout to auscultation.   No wheezes, crackles, or rhonchi. No acute distress. Heart:  Regular rate and rhythm; no murmurs, clicks, rubs,  or gallops. Abdomen: Non-distended, normal bowel sounds.  Soft and nontender without appreciable mass or hepatosplenomegaly.  Pulses:  Normal pulses noted. Extremities:  Without clubbing or edema.  Impression: Postprandial nausea and vomiting. Not much improvement with the past suppression therapy. No apparent dysphagia but difficult to tell for sure.  Recommendations:  I will offer the patient EGD today. Discussed at length  with sister.The risks, benefits, limitations, alternatives and imponderables have been reviewed with the patient. Potential for esophageal dilation, biopsy, etc. have also been reviewed.  Questions have been answered. All parties agreeable.   Notice: This dictation was prepared with Dragon dictation along with smaller phrase technology. Any transcriptional errors that result from this process are unintentional and may not be corrected upon review.

## 2017-11-23 NOTE — Anesthesia Postprocedure Evaluation (Signed)
Anesthesia Post Note  Patient: Tom Russell  Procedure(s) Performed: ESOPHAGOGASTRODUODENOSCOPY (EGD) WITH PROPOFOL (N/A )  Patient location during evaluation: PACU Anesthesia Type: MAC Level of consciousness: awake, oriented and patient cooperative Pain management: pain level controlled Vital Signs Assessment: post-procedure vital signs reviewed and stable Respiratory status: spontaneous breathing and respiratory function stable Cardiovascular status: stable Postop Assessment: no apparent nausea or vomiting Anesthetic complications: no     Last Vitals:  Vitals:   11/23/17 0715 11/23/17 0730  BP: (!) 128/91   Resp: 10 (!) 0  Temp:    SpO2: 99% 100%    Last Pain:  Vitals:   11/23/17 0637  TempSrc: Oral                 Albert Hersch A

## 2017-11-23 NOTE — Anesthesia Preprocedure Evaluation (Signed)
Anesthesia Evaluation  Patient identified by MRN, date of birth, ID band Patient awake    Reviewed: Allergy & Precautions, NPO status , Patient's Chart, lab work & pertinent test results  Airway Mallampati: II  TM Distance: >3 FB Neck ROM: Full    Dental  (+) Teeth Intact   Pulmonary neg pulmonary ROS,    breath sounds clear to auscultation       Cardiovascular negative cardio ROS   Rhythm:Regular Rate:Normal     Neuro/Psych Seizures -,  PSYCHIATRIC DISORDERS Anxiety Depression CP     GI/Hepatic GERD  ,  Endo/Other    Renal/GU      Musculoskeletal   Abdominal   Peds  Hematology   Anesthesia Other Findings   Reproductive/Obstetrics                             Anesthesia Physical Anesthesia Plan  ASA: III  Anesthesia Plan: MAC   Post-op Pain Management:    Induction: Intravenous  PONV Risk Score and Plan:   Airway Management Planned: Simple Face Mask  Additional Equipment:   Intra-op Plan:   Post-operative Plan:   Informed Consent: I have reviewed the patients History and Physical, chart, labs and discussed the procedure including the risks, benefits and alternatives for the proposed anesthesia with the patient or authorized representative who has indicated his/her understanding and acceptance.     Plan Discussed with:   Anesthesia Plan Comments:         Anesthesia Quick Evaluation

## 2017-11-23 NOTE — Anesthesia Procedure Notes (Signed)
Procedure Name: MAC Date/Time: 11/23/2017 7:31 AM Performed by: Andree Elk Ky Moskowitz A, CRNA Pre-anesthesia Checklist: Patient identified, Emergency Drugs available, Suction available, Patient being monitored and Timeout performed Oxygen Delivery Method: Simple face mask

## 2017-11-23 NOTE — Transfer of Care (Signed)
Immediate Anesthesia Transfer of Care Note  Patient: Tom Russell  Procedure(s) Performed: ESOPHAGOGASTRODUODENOSCOPY (EGD) WITH PROPOFOL (N/A )  Patient Location: PACU  Anesthesia Type:MAC  Level of Consciousness: awake, oriented and patient cooperative  Airway & Oxygen Therapy: Patient Spontanous Breathing and Patient connected to face mask oxygen  Post-op Assessment: Report given to RN and Post -op Vital signs reviewed and stable  Post vital signs: Reviewed and stable  Last Vitals:  Vitals:   11/23/17 0715 11/23/17 0730  BP: (!) 128/91   Resp: 10 (!) 0  Temp:    SpO2: 99% 100%    Last Pain:  Vitals:   11/23/17 0637  TempSrc: Oral      Patients Stated Pain Goal: 2 (56/43/32 9518)  Complications: No apparent anesthesia complications

## 2017-11-23 NOTE — Discharge Instructions (Signed)
EGD Discharge instructions Please read the instructions outlined below and refer to this sheet in the next few weeks. These discharge instructions provide you with general information on caring for yourself after you leave the hospital. Your doctor may also give you specific instructions. While your treatment has been planned according to the most current medical practices available, unavoidable complications occasionally occur. If you have any problems or questions after discharge, please call your doctor. ACTIVITY  You may resume your regular activity but move at a slower pace for the next 24 hours.   Take frequent rest periods for the next 24 hours.   Walking will help expel (get rid of) the air and reduce the bloated feeling in your abdomen.   No driving for 24 hours (because of the anesthesia (medicine) used during the test).   You may shower.   Do not sign any important legal documents or operate any machinery for 24 hours (because of the anesthesia used during the test).  NUTRITION  Drink plenty of fluids.   You may resume your normal diet.   Begin with a light meal and progress to your normal diet.   Avoid alcoholic beverages for 24 hours or as instructed by your caregiver.  MEDICATIONS  You may resume your normal medications unless your caregiver tells you otherwise.  WHAT YOU CAN EXPECT TODAY  You may experience abdominal discomfort such as a feeling of fullness or gas pains.  FOLLOW-UP  Your doctor will discuss the results of your test with you.  SEEK IMMEDIATE MEDICAL ATTENTION IF ANY OF THE FOLLOWING OCCUR:  Excessive nausea (feeling sick to your stomach) and/or vomiting.   Severe abdominal pain and distention (swelling).   Trouble swallowing.   Temperature over 101 F (37.8 C).   Rectal bleeding or vomiting of blood.    Continue omeprazole twice daily  Will schedule a solid phase gastric emptying study to further evaluate nausea and  vomiting  Further recommendations to follow.

## 2017-11-23 NOTE — Op Note (Signed)
[facility] Patient Name: Tom Russell Procedure Date: 11/23/2017 7:20 AM MRN: [id number] Date of Birth: 04/23/1971 Attending MD: Gennette Pac , MD CSN: [id number] Age: 47 Admit Type: Outpatient Procedure:                Upper GI endoscopy Indications:              Nausea with vomiting Providers:                Gennette Pac, MD, Buel Ream. Thomasena Edis RN, RN,                            Edrick Kins, RN Referring MD:              Medicines:                Propofol per Anesthesia Complications:            No immediate complications. Estimated Blood Loss:     Estimated blood loss was minimal. Procedure:                Pre-Anesthesia Assessment:                           - Prior to the procedure, a History and Physical                            was performed, and patient medications and                            allergies were reviewed. The patient's tolerance of                            previous anesthesia was also reviewed. The risks                            and benefits of the procedure and the sedation                            options and risks were discussed with the patient.                            All questions were answered, and informed consent                            was obtained. Prior Anticoagulants: The patient has                            taken no previous anticoagulant or antiplatelet                            agents. ASA Grade Assessment: II - A patient with                            mild systemic disease. After reviewing the risks  and benefits, the patient was deemed in                            satisfactory condition to undergo the procedure.                           After obtaining informed consent, the endoscope was                            passed under direct vision. Throughout the                            procedure, the patient's blood pressure, pulse, and                            oxygen saturations  were monitored continuously. The                            [id number]) was introduced through the and                            advanced to the second part of duodenum. The                            [id number] ([id number]) scope was introduced through the                            and advanced to the. The upper GI endoscopy was                            accomplished without difficulty. The patient                            tolerated the procedure well. The upper GI                            endoscopy was accomplished without difficulty. The                            patient tolerated the procedure well. Scope In: 7:38:34 AM Scope Out: 7:42:16 AM Total Procedure Duration: 0 hours 3 minutes 42 seconds  Findings:      The examined esophagus was normal.      A single 4 mm semi-sessile polyp was found in the gastric fundus.       Otherwise, the gastric mucosa appeared normal.      The duodenal bulb and second portion of the duodenum were normal. This       was biopsied with a cold forceps for histology. Estimated blood loss was       minimal. Estimated blood loss was minimal. Impression:               - Normal esophagus.                           - A single gastric polyp. Biopsied.                           -  Normal duodenal bulb and second portion of the                            duodenum. Moderate Sedation:      Moderate (conscious) sedation was personally administered by an       anesthesia professional. The following parameters were monitored: oxygen       saturation, heart rate, blood pressure, respiratory rate, EKG, adequacy       of pulmonary ventilation, and response to care. Total physician       intraservice time was 9 minutes. Recommendation:           - Patient has a contact number available for                            emergencies. The signs and symptoms of potential                            delayed complications were discussed with the                             patient. Return to normal activities tomorrow.                            Written discharge instructions were provided to the                            patient.                           - Resume previous diet.                           - Continue present medications. Solid-phase gastric                            emptying study to follow.                           - Await pathology results.                           - No repeat upper endoscopy.                           - Return to GI office (date not yet determined). Procedure Code(s):        --- Professional ---                           [phone], Esophagogastroduodenoscopy, flexible,                            transoral; with biopsy, single or multiple Diagnosis Code(s):        --- Professional ---                           K31.7, Polyp of stomach and duodenum  R11.2, Nausea with vomiting, unspecified CPT copyright 2016 American Medical Association. All rights reserved. The codes documented in this report are preliminary and upon coder review may  be revised to meet current compliance requirements. Tom Friendsobert M. Pink Maye, MD Gennette Pacobert Michael Duana Benedict, MD 11/23/2017 7:50:54 AM This report has been signed electronically. Number of Addenda: 0

## 2017-11-24 ENCOUNTER — Encounter (HOSPITAL_COMMUNITY): Payer: Self-pay | Admitting: Internal Medicine

## 2017-12-03 ENCOUNTER — Encounter: Payer: Self-pay | Admitting: Internal Medicine

## 2017-12-07 DIAGNOSIS — F71 Moderate intellectual disabilities: Secondary | ICD-10-CM | POA: Diagnosis not present

## 2018-01-16 DIAGNOSIS — G809 Cerebral palsy, unspecified: Secondary | ICD-10-CM | POA: Diagnosis not present

## 2018-01-16 DIAGNOSIS — S0990XA Unspecified injury of head, initial encounter: Secondary | ICD-10-CM | POA: Diagnosis not present

## 2018-01-16 DIAGNOSIS — R569 Unspecified convulsions: Secondary | ICD-10-CM | POA: Diagnosis not present

## 2018-01-16 DIAGNOSIS — Z79899 Other long term (current) drug therapy: Secondary | ICD-10-CM | POA: Diagnosis not present

## 2018-01-16 DIAGNOSIS — Z23 Encounter for immunization: Secondary | ICD-10-CM | POA: Diagnosis not present

## 2018-01-16 DIAGNOSIS — I639 Cerebral infarction, unspecified: Secondary | ICD-10-CM | POA: Diagnosis not present

## 2018-01-16 DIAGNOSIS — S0181XA Laceration without foreign body of other part of head, initial encounter: Secondary | ICD-10-CM | POA: Diagnosis not present

## 2018-01-16 DIAGNOSIS — W228XXA Striking against or struck by other objects, initial encounter: Secondary | ICD-10-CM | POA: Diagnosis not present

## 2018-02-08 DIAGNOSIS — Z6824 Body mass index (BMI) 24.0-24.9, adult: Secondary | ICD-10-CM | POA: Diagnosis not present

## 2018-02-08 DIAGNOSIS — R569 Unspecified convulsions: Secondary | ICD-10-CM | POA: Diagnosis not present

## 2018-02-08 DIAGNOSIS — G809 Cerebral palsy, unspecified: Secondary | ICD-10-CM | POA: Diagnosis not present

## 2018-02-08 DIAGNOSIS — Z299 Encounter for prophylactic measures, unspecified: Secondary | ICD-10-CM | POA: Diagnosis not present

## 2018-02-08 DIAGNOSIS — Z01818 Encounter for other preprocedural examination: Secondary | ICD-10-CM | POA: Diagnosis not present

## 2018-02-23 ENCOUNTER — Other Ambulatory Visit: Payer: Self-pay | Admitting: Nurse Practitioner

## 2018-03-15 DIAGNOSIS — F71 Moderate intellectual disabilities: Secondary | ICD-10-CM | POA: Diagnosis not present

## 2018-06-20 DIAGNOSIS — G809 Cerebral palsy, unspecified: Secondary | ICD-10-CM | POA: Diagnosis not present

## 2018-06-20 DIAGNOSIS — R569 Unspecified convulsions: Secondary | ICD-10-CM | POA: Diagnosis not present

## 2018-06-20 DIAGNOSIS — Z6824 Body mass index (BMI) 24.0-24.9, adult: Secondary | ICD-10-CM | POA: Diagnosis not present

## 2018-06-20 DIAGNOSIS — R5383 Other fatigue: Secondary | ICD-10-CM | POA: Diagnosis not present

## 2018-06-20 DIAGNOSIS — Z299 Encounter for prophylactic measures, unspecified: Secondary | ICD-10-CM | POA: Diagnosis not present

## 2018-06-20 DIAGNOSIS — Z79899 Other long term (current) drug therapy: Secondary | ICD-10-CM | POA: Diagnosis not present

## 2018-06-21 DIAGNOSIS — F71 Moderate intellectual disabilities: Secondary | ICD-10-CM | POA: Diagnosis not present

## 2018-08-22 DIAGNOSIS — Z299 Encounter for prophylactic measures, unspecified: Secondary | ICD-10-CM | POA: Diagnosis not present

## 2018-08-22 DIAGNOSIS — Z789 Other specified health status: Secondary | ICD-10-CM | POA: Diagnosis not present

## 2018-08-22 DIAGNOSIS — Z6824 Body mass index (BMI) 24.0-24.9, adult: Secondary | ICD-10-CM | POA: Diagnosis not present

## 2018-08-22 DIAGNOSIS — G809 Cerebral palsy, unspecified: Secondary | ICD-10-CM | POA: Diagnosis not present

## 2018-08-30 DIAGNOSIS — Z0000 Encounter for general adult medical examination without abnormal findings: Secondary | ICD-10-CM | POA: Diagnosis not present

## 2018-08-30 DIAGNOSIS — F0280 Dementia in other diseases classified elsewhere without behavioral disturbance: Secondary | ICD-10-CM | POA: Diagnosis not present

## 2018-08-30 DIAGNOSIS — Z7189 Other specified counseling: Secondary | ICD-10-CM | POA: Diagnosis not present

## 2018-08-30 DIAGNOSIS — Z299 Encounter for prophylactic measures, unspecified: Secondary | ICD-10-CM | POA: Diagnosis not present

## 2018-08-30 DIAGNOSIS — Z6824 Body mass index (BMI) 24.0-24.9, adult: Secondary | ICD-10-CM | POA: Diagnosis not present

## 2018-08-30 DIAGNOSIS — G309 Alzheimer's disease, unspecified: Secondary | ICD-10-CM | POA: Diagnosis not present

## 2018-08-30 DIAGNOSIS — Z1331 Encounter for screening for depression: Secondary | ICD-10-CM | POA: Diagnosis not present

## 2018-08-30 DIAGNOSIS — Z1339 Encounter for screening examination for other mental health and behavioral disorders: Secondary | ICD-10-CM | POA: Diagnosis not present

## 2018-08-30 DIAGNOSIS — J449 Chronic obstructive pulmonary disease, unspecified: Secondary | ICD-10-CM | POA: Diagnosis not present

## 2018-08-31 DIAGNOSIS — Z79899 Other long term (current) drug therapy: Secondary | ICD-10-CM | POA: Diagnosis not present

## 2018-08-31 DIAGNOSIS — E785 Hyperlipidemia, unspecified: Secondary | ICD-10-CM | POA: Diagnosis not present

## 2018-09-14 DIAGNOSIS — Z299 Encounter for prophylactic measures, unspecified: Secondary | ICD-10-CM | POA: Diagnosis not present

## 2018-09-14 DIAGNOSIS — G809 Cerebral palsy, unspecified: Secondary | ICD-10-CM | POA: Diagnosis not present

## 2018-09-14 DIAGNOSIS — M79645 Pain in left finger(s): Secondary | ICD-10-CM | POA: Diagnosis not present

## 2018-09-14 DIAGNOSIS — M19042 Primary osteoarthritis, left hand: Secondary | ICD-10-CM | POA: Diagnosis not present

## 2018-09-14 DIAGNOSIS — Z6824 Body mass index (BMI) 24.0-24.9, adult: Secondary | ICD-10-CM | POA: Diagnosis not present

## 2018-09-14 DIAGNOSIS — R569 Unspecified convulsions: Secondary | ICD-10-CM | POA: Diagnosis not present

## 2018-09-14 DIAGNOSIS — E785 Hyperlipidemia, unspecified: Secondary | ICD-10-CM | POA: Diagnosis not present

## 2018-09-26 DIAGNOSIS — F71 Moderate intellectual disabilities: Secondary | ICD-10-CM | POA: Diagnosis not present

## 2018-11-10 ENCOUNTER — Other Ambulatory Visit: Payer: Self-pay | Admitting: Gastroenterology

## 2018-11-10 DIAGNOSIS — R112 Nausea with vomiting, unspecified: Secondary | ICD-10-CM

## 2018-11-10 DIAGNOSIS — R1013 Epigastric pain: Secondary | ICD-10-CM

## 2018-11-30 DIAGNOSIS — Z789 Other specified health status: Secondary | ICD-10-CM | POA: Diagnosis not present

## 2018-11-30 DIAGNOSIS — Z6824 Body mass index (BMI) 24.0-24.9, adult: Secondary | ICD-10-CM | POA: Diagnosis not present

## 2018-11-30 DIAGNOSIS — G809 Cerebral palsy, unspecified: Secondary | ICD-10-CM | POA: Diagnosis not present

## 2018-11-30 DIAGNOSIS — Z299 Encounter for prophylactic measures, unspecified: Secondary | ICD-10-CM | POA: Diagnosis not present

## 2018-11-30 DIAGNOSIS — F79 Unspecified intellectual disabilities: Secondary | ICD-10-CM | POA: Diagnosis not present

## 2018-11-30 DIAGNOSIS — R569 Unspecified convulsions: Secondary | ICD-10-CM | POA: Diagnosis not present

## 2018-11-30 DIAGNOSIS — R350 Frequency of micturition: Secondary | ICD-10-CM | POA: Diagnosis not present

## 2018-12-03 DIAGNOSIS — R32 Unspecified urinary incontinence: Secondary | ICD-10-CM | POA: Diagnosis not present

## 2018-12-28 DIAGNOSIS — F419 Anxiety disorder, unspecified: Secondary | ICD-10-CM | POA: Diagnosis not present

## 2019-01-01 DIAGNOSIS — R350 Frequency of micturition: Secondary | ICD-10-CM | POA: Diagnosis not present

## 2019-01-01 DIAGNOSIS — F79 Unspecified intellectual disabilities: Secondary | ICD-10-CM | POA: Diagnosis not present

## 2019-01-01 DIAGNOSIS — Z6824 Body mass index (BMI) 24.0-24.9, adult: Secondary | ICD-10-CM | POA: Diagnosis not present

## 2019-01-01 DIAGNOSIS — G809 Cerebral palsy, unspecified: Secondary | ICD-10-CM | POA: Diagnosis not present

## 2019-01-01 DIAGNOSIS — Z299 Encounter for prophylactic measures, unspecified: Secondary | ICD-10-CM | POA: Diagnosis not present

## 2019-03-06 ENCOUNTER — Encounter: Payer: Self-pay | Admitting: Orthopaedic Surgery

## 2019-03-06 ENCOUNTER — Other Ambulatory Visit: Payer: Self-pay

## 2019-03-06 ENCOUNTER — Ambulatory Visit (INDEPENDENT_AMBULATORY_CARE_PROVIDER_SITE_OTHER): Payer: Medicare Other

## 2019-03-06 ENCOUNTER — Ambulatory Visit (INDEPENDENT_AMBULATORY_CARE_PROVIDER_SITE_OTHER): Payer: Medicare Other | Admitting: Orthopaedic Surgery

## 2019-03-06 VITALS — Ht 64.0 in | Wt 155.0 lb

## 2019-03-06 DIAGNOSIS — G809 Cerebral palsy, unspecified: Secondary | ICD-10-CM | POA: Diagnosis not present

## 2019-03-06 DIAGNOSIS — S61309A Unspecified open wound of unspecified finger with damage to nail, initial encounter: Secondary | ICD-10-CM | POA: Diagnosis not present

## 2019-03-06 DIAGNOSIS — F79 Unspecified intellectual disabilities: Secondary | ICD-10-CM | POA: Diagnosis not present

## 2019-03-06 DIAGNOSIS — M79644 Pain in right finger(s): Secondary | ICD-10-CM | POA: Diagnosis not present

## 2019-03-06 DIAGNOSIS — Z299 Encounter for prophylactic measures, unspecified: Secondary | ICD-10-CM | POA: Diagnosis not present

## 2019-03-06 DIAGNOSIS — L309 Dermatitis, unspecified: Secondary | ICD-10-CM | POA: Diagnosis not present

## 2019-03-06 DIAGNOSIS — Z6824 Body mass index (BMI) 24.0-24.9, adult: Secondary | ICD-10-CM | POA: Diagnosis not present

## 2019-03-06 NOTE — Progress Notes (Signed)
Office Visit Note   Patient: Tom Russell           Date of Birth: [birth year]           MRN: [id number] Visit Date: 03/06/2019              Requested by: Kirstie Peri, MD [address] [place], [place] [address] PCP: Kirstie Peri, MD   Assessment & Plan: Visit Diagnoses:  1. Pain in right finger(s)     Plan: Avulsion of right ring finger nail with underlying fungal infection.  Evidence of fracture.  Will apply me appropriate Senden and a Band-Aid daily.  Follow-up 1 week.  Follow-Up Instructions: Return in about 1 week (around 03/13/2019).   Orders:  Orders Placed This Encounter  Procedures  . XR Finger Ring Right   No orders of the defined types were placed in this encounter.     Procedures: No procedures performed   Clinical Data: No additional findings.   Subjective: Chief Complaint  Patient presents with  . Right Hand - Pain  Patient presents today with right ring finger pain. He caught his finger in the wheelchair one week ago. The nail is coming off and states that it is a little painful. He has been using an antibiotic cream.  Mr. Bayers is at a group home and is accompanied by 1 of the staff members. He is not had any fever or chills but has had some discomfort at the right ring finger nail after the injury as noted above. HPI  Review of Systems   Objective: Vital Signs: Ht 5\' 4"  (1.626 m)   Wt 155 lb (70.3 kg)   BMI 26.61 kg/m   Physical Exam Constitutional:      Appearance: He is well-developed.  Pulmonary:     Effort: Pulmonary effort is normal.  Skin:    General: Skin is warm and dry.  Neurological:     Mental Status: He is alert and oriented to person, place, and time.  Psychiatric:        Behavior: Behavior normal.     Ortho Exam right ring finger with a very loose nail there is evidence of underlying fungal infection but without any drainage.  The nail was just attached at the very base and I was able to easily remove it without any pain.   There was minimal bleeding.  There is a rudimentary nail beneath that is consistent with thickened fungal infection.  No drainage.  No redness.  No ecchymosis.  No significant tenderness to palpation.  No deformity.  Films of the finger were negative for any fracture.  Specialty Comments:  No specialty comments available.  Imaging: Xr Finger Ring Right  Result Date: 03/06/2019 Films of the right ring finger were obtained without evidence of acute injury.  Patient has an injury to the nail but without evidence of a fracture no degenerative changes about the DIP joint    PMFS History: Patient Active Problem List   Diagnosis Date Noted  . Pain in right finger(s) 03/06/2019  . Nausea with vomiting 11/14/2016  . Dyspepsia 11/14/2016   Past Medical History:  Diagnosis Date  . Anxiety   . Cerebral palsy (HCC)   . Depression   . GERD (gastroesophageal reflux disease)   . Mental retardation   . Seizures (HCC)     Family History  Family history unknown: Yes    Past Surgical History:  Procedure Laterality Date  . ESOPHAGOGASTRODUODENOSCOPY (EGD) WITH PROPOFOL N/A 11/23/2017  Procedure: ESOPHAGOGASTRODUODENOSCOPY (EGD) WITH PROPOFOL;  Surgeon: Corbin Adeourk, Robert M, MD;  Location: [facility];  Service: Endoscopy;  Laterality: N/A;  8:15am  . None to date     as of 11/14/16   Social History   Occupational History  . Not on file  Tobacco Use  . Smoking status: Never Smoker  . Smokeless tobacco: Never Used  Substance and Sexual Activity  . Alcohol use: No  . Drug use: No  . Sexual activity: Never    Birth control/protection: None

## 2019-03-13 ENCOUNTER — Ambulatory Visit: Payer: Medicare Other | Admitting: Orthopaedic Surgery

## 2019-03-13 ENCOUNTER — Other Ambulatory Visit: Payer: Self-pay

## 2019-03-20 ENCOUNTER — Other Ambulatory Visit: Payer: Self-pay

## 2019-03-20 ENCOUNTER — Ambulatory Visit: Payer: Medicare Other | Admitting: Orthopaedic Surgery

## 2019-03-25 DIAGNOSIS — F419 Anxiety disorder, unspecified: Secondary | ICD-10-CM | POA: Diagnosis not present

## 2019-04-02 DIAGNOSIS — F79 Unspecified intellectual disabilities: Secondary | ICD-10-CM | POA: Diagnosis not present

## 2019-04-02 DIAGNOSIS — R569 Unspecified convulsions: Secondary | ICD-10-CM | POA: Diagnosis not present

## 2019-04-02 DIAGNOSIS — Z6824 Body mass index (BMI) 24.0-24.9, adult: Secondary | ICD-10-CM | POA: Diagnosis not present

## 2019-04-02 DIAGNOSIS — Z299 Encounter for prophylactic measures, unspecified: Secondary | ICD-10-CM | POA: Diagnosis not present

## 2019-04-02 DIAGNOSIS — G809 Cerebral palsy, unspecified: Secondary | ICD-10-CM | POA: Diagnosis not present

## 2019-04-10 DIAGNOSIS — R1110 Vomiting, unspecified: Secondary | ICD-10-CM | POA: Diagnosis not present

## 2019-04-10 DIAGNOSIS — Z299 Encounter for prophylactic measures, unspecified: Secondary | ICD-10-CM | POA: Diagnosis not present

## 2019-04-10 DIAGNOSIS — R1084 Generalized abdominal pain: Secondary | ICD-10-CM | POA: Diagnosis not present

## 2019-04-10 DIAGNOSIS — R112 Nausea with vomiting, unspecified: Secondary | ICD-10-CM | POA: Diagnosis not present

## 2019-04-10 DIAGNOSIS — K5900 Constipation, unspecified: Secondary | ICD-10-CM | POA: Diagnosis not present

## 2019-04-10 DIAGNOSIS — G809 Cerebral palsy, unspecified: Secondary | ICD-10-CM | POA: Diagnosis not present

## 2019-04-10 DIAGNOSIS — Z6824 Body mass index (BMI) 24.0-24.9, adult: Secondary | ICD-10-CM | POA: Diagnosis not present

## 2019-04-10 DIAGNOSIS — R569 Unspecified convulsions: Secondary | ICD-10-CM | POA: Diagnosis not present

## 2019-04-10 DIAGNOSIS — Z79899 Other long term (current) drug therapy: Secondary | ICD-10-CM | POA: Diagnosis not present

## 2019-04-10 DIAGNOSIS — E871 Hypo-osmolality and hyponatremia: Secondary | ICD-10-CM | POA: Diagnosis not present

## 2019-04-10 DIAGNOSIS — Z789 Other specified health status: Secondary | ICD-10-CM | POA: Diagnosis not present

## 2019-04-18 DIAGNOSIS — S6992XA Unspecified injury of left wrist, hand and finger(s), initial encounter: Secondary | ICD-10-CM | POA: Diagnosis not present

## 2019-04-18 DIAGNOSIS — M79642 Pain in left hand: Secondary | ICD-10-CM | POA: Diagnosis not present

## 2019-04-18 DIAGNOSIS — Z789 Other specified health status: Secondary | ICD-10-CM | POA: Diagnosis not present

## 2019-04-18 DIAGNOSIS — M25552 Pain in left hip: Secondary | ICD-10-CM | POA: Diagnosis not present

## 2019-04-18 DIAGNOSIS — S79912A Unspecified injury of left hip, initial encounter: Secondary | ICD-10-CM | POA: Diagnosis not present

## 2019-04-18 DIAGNOSIS — W050XXA Fall from non-moving wheelchair, initial encounter: Secondary | ICD-10-CM | POA: Diagnosis not present

## 2019-04-18 DIAGNOSIS — Z299 Encounter for prophylactic measures, unspecified: Secondary | ICD-10-CM | POA: Diagnosis not present

## 2019-04-18 DIAGNOSIS — R569 Unspecified convulsions: Secondary | ICD-10-CM | POA: Diagnosis not present

## 2019-04-18 DIAGNOSIS — M79645 Pain in left finger(s): Secondary | ICD-10-CM | POA: Diagnosis not present

## 2019-05-28 DIAGNOSIS — R112 Nausea with vomiting, unspecified: Secondary | ICD-10-CM | POA: Diagnosis not present

## 2019-05-28 DIAGNOSIS — Z299 Encounter for prophylactic measures, unspecified: Secondary | ICD-10-CM | POA: Diagnosis not present

## 2019-05-28 DIAGNOSIS — Z6824 Body mass index (BMI) 24.0-24.9, adult: Secondary | ICD-10-CM | POA: Diagnosis not present

## 2019-05-28 DIAGNOSIS — R1011 Right upper quadrant pain: Secondary | ICD-10-CM | POA: Diagnosis not present

## 2019-05-28 DIAGNOSIS — G809 Cerebral palsy, unspecified: Secondary | ICD-10-CM | POA: Diagnosis not present

## 2019-05-30 DIAGNOSIS — S025XXA Fracture of tooth (traumatic), initial encounter for closed fracture: Secondary | ICD-10-CM | POA: Diagnosis not present

## 2019-05-30 DIAGNOSIS — S00511A Abrasion of lip, initial encounter: Secondary | ICD-10-CM | POA: Diagnosis not present

## 2019-05-30 DIAGNOSIS — Z79899 Other long term (current) drug therapy: Secondary | ICD-10-CM | POA: Diagnosis not present

## 2019-05-30 DIAGNOSIS — R040 Epistaxis: Secondary | ICD-10-CM | POA: Diagnosis not present

## 2019-05-30 DIAGNOSIS — W19XXXA Unspecified fall, initial encounter: Secondary | ICD-10-CM | POA: Diagnosis not present

## 2019-05-30 DIAGNOSIS — R569 Unspecified convulsions: Secondary | ICD-10-CM | POA: Diagnosis not present

## 2019-05-30 DIAGNOSIS — S0031XA Abrasion of nose, initial encounter: Secondary | ICD-10-CM | POA: Diagnosis not present

## 2019-05-30 DIAGNOSIS — G809 Cerebral palsy, unspecified: Secondary | ICD-10-CM | POA: Diagnosis not present

## 2019-05-30 DIAGNOSIS — S0992XA Unspecified injury of nose, initial encounter: Secondary | ICD-10-CM | POA: Diagnosis not present

## 2019-05-31 ENCOUNTER — Encounter: Payer: Self-pay | Admitting: Internal Medicine

## 2019-06-14 DIAGNOSIS — R10811 Right upper quadrant abdominal tenderness: Secondary | ICD-10-CM | POA: Diagnosis not present

## 2019-06-14 DIAGNOSIS — R1011 Right upper quadrant pain: Secondary | ICD-10-CM | POA: Diagnosis not present

## 2019-06-14 DIAGNOSIS — K8020 Calculus of gallbladder without cholecystitis without obstruction: Secondary | ICD-10-CM | POA: Diagnosis not present

## 2019-06-20 DIAGNOSIS — F419 Anxiety disorder, unspecified: Secondary | ICD-10-CM | POA: Diagnosis not present

## 2019-06-21 ENCOUNTER — Ambulatory Visit (INDEPENDENT_AMBULATORY_CARE_PROVIDER_SITE_OTHER): Payer: Medicare Other | Admitting: Internal Medicine

## 2019-06-21 ENCOUNTER — Other Ambulatory Visit: Payer: Self-pay | Admitting: *Deleted

## 2019-06-21 ENCOUNTER — Encounter: Payer: Self-pay | Admitting: Internal Medicine

## 2019-06-21 ENCOUNTER — Other Ambulatory Visit: Payer: Self-pay

## 2019-06-21 VITALS — BP 122/80 | HR 63 | Temp 95.9°F | Ht 64.0 in

## 2019-06-21 DIAGNOSIS — R112 Nausea with vomiting, unspecified: Secondary | ICD-10-CM

## 2019-06-21 NOTE — Progress Notes (Signed)
Primary Care Physician:  Kirstie PeriShah, Ashish, MD Primary Gastroenterologist:  Dr. Jena Gaussourk  Pre-Procedure History & Physical: HPI:  [name] is a 48 y.o. male here for further evaluation of recurrent intermittent nausea and vomiting.  Patient is a resident of [facility] home.  He is accompanied by 2 caregivers who are very helpful in providing information. Patient is been noted to have episodes of nausea and vomiting sometimes immediately and sometimes within several minutes after eating a meal.  This happens on the order of 4 times weekly.  Accompanying caregivers are in a position to pretty much watch him eat 3 meals a day.  At all the other mealtimes, he seems to eat without difficulty.  Patient has a hard time with eggs, particularly the yolk and sausage in the morning.  At other times, he can eat things like chicken nuggets, Jamaica[place] fries and hamburger steak (yesterday) at lunch and dinner, respectively and had no difficulties whatsoever.  No apparent choking episodes.  No significant abdominal pain or noted fever.  Bowel function is described as normal without any obvious diarrhea, constipation, melena, rectal bleeding. Caregivers report he had a "scan" recently which revealed a normal-appearing gallbladder.  I do not have the report.  It is unknown whether the patient is lost any significant amount of weight recently.  Work-up in early 2019 here included a EGD which demonstrated a fundal gland polyp but otherwise normal upper GI tract.  He has been treated with omeprazole 20 mg twice daily with apparent good control of reflux symptoms.  He has never had a colonoscopy.  Lab work from 2018 revealed a mild anemia with a slightly depressed hematocrit.  Gastric emptying study was recommended after EGD last year but this apparently was never done.  He is not diabetic. On multiple CNS active medications.  He is not taking any narcotics.   Past Medical History:  Diagnosis Date  . Anxiety   . Cerebral  palsy (HCC)   . Depression   . GERD (gastroesophageal reflux disease)   . Mental retardation   . Seizures (HCC)     Past Surgical History:  Procedure Laterality Date  . ESOPHAGOGASTRODUODENOSCOPY (EGD) WITH PROPOFOL N/A 11/23/2017   Procedure: ESOPHAGOGASTRODUODENOSCOPY (EGD) WITH PROPOFOL;  Surgeon: Corbin Adeourk, Han Vejar M, MD;  Location: [facility];  Service: Endoscopy;  Laterality: N/A;  8:15am  . None to date     as of 11/14/16    Prior to Admission medications   Medication Sig Start Date End Date Taking? Authorizing Provider  ARIPiprazole (ABILIFY) 5 MG tablet Take 5 mg by mouth every morning.   Yes [provider]  carbamazepine (CARBATROL) 300 MG 12 hr capsule Take by mouth. Takes 2 capsules in the am, 1 capsule at noon, and 2 capsules at bedtime   Yes [provider]  diazepam (VALIUM) 5 MG tablet Take 5 mg by mouth 2 (two) times daily.   Yes [provider]  fluvoxaMINE (LUVOX) 100 MG tablet Take 100 mg by mouth 2 (two) times daily.   Yes [provider]  gabapentin (NEURONTIN) 800 MG tablet Take 800 mg by mouth 4 (four) times daily.   Yes [provider]  Olopatadine HCl (PAZEO) 0.7 % SOLN Apply to eye daily.    Yes [provider]  omeprazole (PRILOSEC) 20 MG capsule TAKE 1 CAPSULE BY MOUTH TWICE DAILY BEFORE A MEAL 11/12/18  Yes Gelene MinkBoone, Anna W, NP  Polyethylene Glycol 3350 (GAVILAX PO) Take by mouth daily.  17 grams by mouth daily   Yes [provider]  tamsulosin (FLOMAX) 0.4 MG CAPS capsule Take 0.4 mg by mouth.   Yes [provider]  triamcinolone (KENALOG) 0.025 % ointment Apply 1 application topically daily. Apply to rash on face   Yes [provider]  triamcinolone cream (KENALOG) 0.1 % Apply 1 application topically 2 (two) times daily. Apply to hands   Yes [provider]  zolpidem (AMBIEN) 5 MG tablet Take 5 mg by mouth at bedtime.   Yes [provider]    Allergies as of  06/21/2019  . (No Known Allergies)    Family History  Family history unknown: Yes    Social History   Socioeconomic History  . Marital status: Single    Spouse name: Not on file  . Number of children: Not on file  . Years of education: Not on file  . Highest education level: Not on file  Occupational History  . Not on file  Social Needs  . Financial resource strain: Not on file  . Food insecurity    Worry: Not on file    Inability: Not on file  . Transportation needs    Medical: Not on file    Non-medical: Not on file  Tobacco Use  . Smoking status: Never Smoker  . Smokeless tobacco: Never Used  Substance and Sexual Activity  . Alcohol use: No  . Drug use: No  . Sexual activity: Never    Birth control/protection: None  Lifestyle  . Physical activity    Days per week: Not on file    Minutes per session: Not on file  . Stress: Not on file  Relationships  . Social Herbalist on phone: Not on file    Gets together: Not on file    Attends religious service: Not on file    Active member of club or organization: Not on file    Attends meetings of clubs or organizations: Not on file    Relationship status: Not on file  . Intimate partner violence    Fear of current or ex partner: Not on file    Emotionally abused: Not on file    Physically abused: Not on file    Forced sexual activity: Not on file  Other Topics Concern  . Not on file  Social History Narrative  . Not on file    Review of Systems: See HPI, otherwise negative ROS  Physical Exam: BP 122/80   Pulse 63   Temp (!) 95.9 F (35.5 C) (Temporal)   Ht 5\' 4"  (1.626 m)   BMI 26.61 kg/m  General: Awake, wheelchair bound.  Very little verbalization.  He has disconjugate gaze. Eyes:  Sclera clear, no icterus.   Conjunctiva pink. Abdomen: Non-distended, normal bowel sounds.  Soft and nontender without appreciable mass or hepatosplenomegaly.  Pulses:  Normal pulses noted. Extremities:  Without  clubbing or edema.  Impression/Plan: 48 year old gentleman with profound mental challenges secondary to cerebral palsy observed to have intermittent episodes of essentially exclusive postprandial nausea and vomiting.  It occurs about 4 times weekly.  It does indeed sound like actual vomiting.  Does not sound like he is choked during a meal.  This occurs after ingesting a meal.  No significant abdominal pain.  The majority of the time the patient apparently does very well.  It does not sound like were dealing with rumination syndrome.  It does sound like intermittent nausea and vomiting.  However,  episodes are punctuated by the ability to eat numerous meals without apparent difficulty.  A functional component cannot be excluded at this time.  He did have a mild depressed hematocrit with his last lab work done in our system.  Gastroparesis has been entertained as a potential contributing factor previously.  Gastric emptying study was never done.  Normal appearing esophagus last year on omeprazole 20 mg twice daily  Recommendations Continue Omeprazole 20 mg twice daily  Solid phase [facility] - post-prandial nausea and vomiting  CBC  Further recommendations to follow  We will discuss a first ever screening colonoscopy next year     Notice: This dictation was prepared with Dragon dictation along with smaller phrase technology. Any transcriptional errors that result from this process are unintentional and may not be corrected upon review.

## 2019-06-21 NOTE — Patient Instructions (Signed)
Continue Omeprazole 20 mg twice daily  Solid phase G[facility] - post-prandial nausea and vomiting  CBC  Further recommendations to follow  We will discuss a first ever screening colonoscopy next year

## 2019-07-08 DIAGNOSIS — F79 Unspecified intellectual disabilities: Secondary | ICD-10-CM | POA: Diagnosis not present

## 2019-07-08 DIAGNOSIS — Z299 Encounter for prophylactic measures, unspecified: Secondary | ICD-10-CM | POA: Diagnosis not present

## 2019-07-08 DIAGNOSIS — G809 Cerebral palsy, unspecified: Secondary | ICD-10-CM | POA: Diagnosis not present

## 2019-07-08 DIAGNOSIS — Z6824 Body mass index (BMI) 24.0-24.9, adult: Secondary | ICD-10-CM | POA: Diagnosis not present

## 2019-07-08 DIAGNOSIS — H1132 Conjunctival hemorrhage, left eye: Secondary | ICD-10-CM | POA: Diagnosis not present

## 2019-07-29 DIAGNOSIS — Z23 Encounter for immunization: Secondary | ICD-10-CM | POA: Diagnosis not present

## 2019-08-23 DIAGNOSIS — M6281 Muscle weakness (generalized): Secondary | ICD-10-CM | POA: Diagnosis not present

## 2019-08-23 DIAGNOSIS — R269 Unspecified abnormalities of gait and mobility: Secondary | ICD-10-CM | POA: Diagnosis not present

## 2019-08-23 DIAGNOSIS — R296 Repeated falls: Secondary | ICD-10-CM | POA: Diagnosis not present

## 2019-08-28 DIAGNOSIS — M6281 Muscle weakness (generalized): Secondary | ICD-10-CM | POA: Diagnosis not present

## 2019-08-28 DIAGNOSIS — R269 Unspecified abnormalities of gait and mobility: Secondary | ICD-10-CM | POA: Diagnosis not present

## 2019-08-28 DIAGNOSIS — R296 Repeated falls: Secondary | ICD-10-CM | POA: Diagnosis not present

## 2019-08-30 DIAGNOSIS — R269 Unspecified abnormalities of gait and mobility: Secondary | ICD-10-CM | POA: Diagnosis not present

## 2019-08-30 DIAGNOSIS — M6281 Muscle weakness (generalized): Secondary | ICD-10-CM | POA: Diagnosis not present

## 2019-08-30 DIAGNOSIS — R296 Repeated falls: Secondary | ICD-10-CM | POA: Diagnosis not present

## 2019-09-04 DIAGNOSIS — R269 Unspecified abnormalities of gait and mobility: Secondary | ICD-10-CM | POA: Diagnosis not present

## 2019-09-04 DIAGNOSIS — M6281 Muscle weakness (generalized): Secondary | ICD-10-CM | POA: Diagnosis not present

## 2019-09-04 DIAGNOSIS — R296 Repeated falls: Secondary | ICD-10-CM | POA: Diagnosis not present

## 2019-09-11 DIAGNOSIS — R269 Unspecified abnormalities of gait and mobility: Secondary | ICD-10-CM | POA: Diagnosis not present

## 2019-09-11 DIAGNOSIS — R296 Repeated falls: Secondary | ICD-10-CM | POA: Diagnosis not present

## 2019-09-11 DIAGNOSIS — M6281 Muscle weakness (generalized): Secondary | ICD-10-CM | POA: Diagnosis not present

## 2019-09-13 DIAGNOSIS — R296 Repeated falls: Secondary | ICD-10-CM | POA: Diagnosis not present

## 2019-09-13 DIAGNOSIS — M6281 Muscle weakness (generalized): Secondary | ICD-10-CM | POA: Diagnosis not present

## 2019-09-13 DIAGNOSIS — R269 Unspecified abnormalities of gait and mobility: Secondary | ICD-10-CM | POA: Diagnosis not present

## 2019-09-27 DIAGNOSIS — M6281 Muscle weakness (generalized): Secondary | ICD-10-CM | POA: Diagnosis not present

## 2019-09-27 DIAGNOSIS — R296 Repeated falls: Secondary | ICD-10-CM | POA: Diagnosis not present

## 2019-09-27 DIAGNOSIS — R269 Unspecified abnormalities of gait and mobility: Secondary | ICD-10-CM | POA: Diagnosis not present

## 2019-10-01 DIAGNOSIS — R269 Unspecified abnormalities of gait and mobility: Secondary | ICD-10-CM | POA: Diagnosis not present

## 2019-10-01 DIAGNOSIS — R296 Repeated falls: Secondary | ICD-10-CM | POA: Diagnosis not present

## 2019-10-01 DIAGNOSIS — M6281 Muscle weakness (generalized): Secondary | ICD-10-CM | POA: Diagnosis not present

## 2019-10-24 DIAGNOSIS — F419 Anxiety disorder, unspecified: Secondary | ICD-10-CM | POA: Diagnosis not present

## 2019-11-05 ENCOUNTER — Other Ambulatory Visit: Payer: Self-pay | Admitting: Gastroenterology

## 2019-11-05 DIAGNOSIS — R1013 Epigastric pain: Secondary | ICD-10-CM

## 2019-11-05 DIAGNOSIS — R112 Nausea with vomiting, unspecified: Secondary | ICD-10-CM

## 2019-12-30 DIAGNOSIS — Z299 Encounter for prophylactic measures, unspecified: Secondary | ICD-10-CM | POA: Diagnosis not present

## 2019-12-30 DIAGNOSIS — F79 Unspecified intellectual disabilities: Secondary | ICD-10-CM | POA: Diagnosis not present

## 2019-12-30 DIAGNOSIS — G809 Cerebral palsy, unspecified: Secondary | ICD-10-CM | POA: Diagnosis not present

## 2019-12-30 DIAGNOSIS — R569 Unspecified convulsions: Secondary | ICD-10-CM | POA: Diagnosis not present

## 2020-01-08 DIAGNOSIS — R32 Unspecified urinary incontinence: Secondary | ICD-10-CM | POA: Diagnosis not present

## 2020-01-08 DIAGNOSIS — F79 Unspecified intellectual disabilities: Secondary | ICD-10-CM | POA: Diagnosis not present

## 2020-01-08 DIAGNOSIS — G809 Cerebral palsy, unspecified: Secondary | ICD-10-CM | POA: Diagnosis not present

## 2020-01-08 DIAGNOSIS — N390 Urinary tract infection, site not specified: Secondary | ICD-10-CM | POA: Diagnosis not present

## 2020-01-08 DIAGNOSIS — Z299 Encounter for prophylactic measures, unspecified: Secondary | ICD-10-CM | POA: Diagnosis not present

## 2020-01-21 DIAGNOSIS — F419 Anxiety disorder, unspecified: Secondary | ICD-10-CM | POA: Diagnosis not present

## 2020-01-22 DIAGNOSIS — F79 Unspecified intellectual disabilities: Secondary | ICD-10-CM | POA: Diagnosis not present

## 2020-01-22 DIAGNOSIS — G809 Cerebral palsy, unspecified: Secondary | ICD-10-CM | POA: Diagnosis not present

## 2020-01-22 DIAGNOSIS — R350 Frequency of micturition: Secondary | ICD-10-CM | POA: Diagnosis not present

## 2020-01-22 DIAGNOSIS — Z299 Encounter for prophylactic measures, unspecified: Secondary | ICD-10-CM | POA: Diagnosis not present

## 2020-02-02 DIAGNOSIS — S0121XA Laceration without foreign body of nose, initial encounter: Secondary | ICD-10-CM | POA: Diagnosis not present

## 2020-02-04 DIAGNOSIS — F419 Anxiety disorder, unspecified: Secondary | ICD-10-CM | POA: Diagnosis not present

## 2020-05-06 ENCOUNTER — Other Ambulatory Visit: Payer: Self-pay | Admitting: Nurse Practitioner

## 2020-05-06 DIAGNOSIS — R112 Nausea with vomiting, unspecified: Secondary | ICD-10-CM

## 2020-05-06 DIAGNOSIS — R1013 Epigastric pain: Secondary | ICD-10-CM

## 2020-05-13 DIAGNOSIS — G809 Cerebral palsy, unspecified: Secondary | ICD-10-CM | POA: Diagnosis not present

## 2020-05-13 DIAGNOSIS — R569 Unspecified convulsions: Secondary | ICD-10-CM | POA: Diagnosis not present

## 2020-05-13 DIAGNOSIS — F79 Unspecified intellectual disabilities: Secondary | ICD-10-CM | POA: Diagnosis not present

## 2020-05-13 DIAGNOSIS — Z299 Encounter for prophylactic measures, unspecified: Secondary | ICD-10-CM | POA: Diagnosis not present

## 2020-05-20 DIAGNOSIS — F419 Anxiety disorder, unspecified: Secondary | ICD-10-CM | POA: Diagnosis not present

## 2020-05-26 DIAGNOSIS — Z1339 Encounter for screening examination for other mental health and behavioral disorders: Secondary | ICD-10-CM | POA: Diagnosis not present

## 2020-05-26 DIAGNOSIS — R5383 Other fatigue: Secondary | ICD-10-CM | POA: Diagnosis not present

## 2020-05-26 DIAGNOSIS — Z0000 Encounter for general adult medical examination without abnormal findings: Secondary | ICD-10-CM | POA: Diagnosis not present

## 2020-05-26 DIAGNOSIS — Z299 Encounter for prophylactic measures, unspecified: Secondary | ICD-10-CM | POA: Diagnosis not present

## 2020-05-26 DIAGNOSIS — Z79899 Other long term (current) drug therapy: Secondary | ICD-10-CM | POA: Diagnosis not present

## 2020-05-26 DIAGNOSIS — E785 Hyperlipidemia, unspecified: Secondary | ICD-10-CM | POA: Diagnosis not present

## 2020-05-26 DIAGNOSIS — Z7189 Other specified counseling: Secondary | ICD-10-CM | POA: Diagnosis not present

## 2020-05-26 DIAGNOSIS — Z1331 Encounter for screening for depression: Secondary | ICD-10-CM | POA: Diagnosis not present

## 2020-05-26 DIAGNOSIS — Z125 Encounter for screening for malignant neoplasm of prostate: Secondary | ICD-10-CM | POA: Diagnosis not present

## 2020-05-26 DIAGNOSIS — G809 Cerebral palsy, unspecified: Secondary | ICD-10-CM | POA: Diagnosis not present

## 2020-05-26 DIAGNOSIS — Z6824 Body mass index (BMI) 24.0-24.9, adult: Secondary | ICD-10-CM | POA: Diagnosis not present

## 2020-05-28 DIAGNOSIS — Z23 Encounter for immunization: Secondary | ICD-10-CM | POA: Diagnosis not present

## 2020-06-11 DIAGNOSIS — M79676 Pain in unspecified toe(s): Secondary | ICD-10-CM | POA: Diagnosis not present

## 2020-06-11 DIAGNOSIS — B351 Tinea unguium: Secondary | ICD-10-CM | POA: Diagnosis not present

## 2020-06-25 DIAGNOSIS — Z23 Encounter for immunization: Secondary | ICD-10-CM | POA: Diagnosis not present

## 2020-07-08 DIAGNOSIS — R569 Unspecified convulsions: Secondary | ICD-10-CM | POA: Diagnosis not present

## 2020-07-08 DIAGNOSIS — G809 Cerebral palsy, unspecified: Secondary | ICD-10-CM | POA: Diagnosis not present

## 2020-07-08 DIAGNOSIS — Z299 Encounter for prophylactic measures, unspecified: Secondary | ICD-10-CM | POA: Diagnosis not present

## 2020-07-08 DIAGNOSIS — F79 Unspecified intellectual disabilities: Secondary | ICD-10-CM | POA: Diagnosis not present

## 2020-07-09 DIAGNOSIS — G809 Cerebral palsy, unspecified: Secondary | ICD-10-CM | POA: Diagnosis not present

## 2020-07-09 DIAGNOSIS — F79 Unspecified intellectual disabilities: Secondary | ICD-10-CM | POA: Diagnosis not present

## 2020-07-14 DIAGNOSIS — Z23 Encounter for immunization: Secondary | ICD-10-CM | POA: Diagnosis not present

## 2020-08-19 DIAGNOSIS — F419 Anxiety disorder, unspecified: Secondary | ICD-10-CM | POA: Diagnosis not present

## 2020-09-08 DIAGNOSIS — G809 Cerebral palsy, unspecified: Secondary | ICD-10-CM | POA: Diagnosis not present

## 2020-09-08 DIAGNOSIS — E7849 Other hyperlipidemia: Secondary | ICD-10-CM | POA: Diagnosis not present

## 2020-09-08 DIAGNOSIS — F79 Unspecified intellectual disabilities: Secondary | ICD-10-CM | POA: Diagnosis not present

## 2020-10-07 DIAGNOSIS — E785 Hyperlipidemia, unspecified: Secondary | ICD-10-CM | POA: Diagnosis not present

## 2020-10-07 DIAGNOSIS — R569 Unspecified convulsions: Secondary | ICD-10-CM | POA: Diagnosis not present

## 2020-10-07 DIAGNOSIS — R451 Restlessness and agitation: Secondary | ICD-10-CM | POA: Diagnosis not present

## 2020-10-07 DIAGNOSIS — G809 Cerebral palsy, unspecified: Secondary | ICD-10-CM | POA: Diagnosis not present

## 2020-10-07 DIAGNOSIS — Z299 Encounter for prophylactic measures, unspecified: Secondary | ICD-10-CM | POA: Diagnosis not present

## 2020-10-09 DIAGNOSIS — G809 Cerebral palsy, unspecified: Secondary | ICD-10-CM | POA: Diagnosis not present

## 2020-10-09 DIAGNOSIS — I10 Essential (primary) hypertension: Secondary | ICD-10-CM | POA: Diagnosis not present

## 2020-11-09 DIAGNOSIS — I10 Essential (primary) hypertension: Secondary | ICD-10-CM | POA: Diagnosis not present

## 2020-11-09 DIAGNOSIS — G809 Cerebral palsy, unspecified: Secondary | ICD-10-CM | POA: Diagnosis not present

## 2020-11-13 DIAGNOSIS — F419 Anxiety disorder, unspecified: Secondary | ICD-10-CM | POA: Diagnosis not present

## 2020-12-11 ENCOUNTER — Other Ambulatory Visit: Payer: Self-pay

## 2020-12-11 ENCOUNTER — Ambulatory Visit (INDEPENDENT_AMBULATORY_CARE_PROVIDER_SITE_OTHER): Payer: Medicare Other | Admitting: Gastroenterology

## 2020-12-11 ENCOUNTER — Encounter: Payer: Self-pay | Admitting: Gastroenterology

## 2020-12-11 VITALS — BP 128/86 | HR 84 | Temp 97.3°F | Ht 64.0 in

## 2020-12-11 DIAGNOSIS — R112 Nausea with vomiting, unspecified: Secondary | ICD-10-CM | POA: Diagnosis not present

## 2020-12-11 DIAGNOSIS — Z1211 Encounter for screening for malignant neoplasm of colon: Secondary | ICD-10-CM | POA: Diagnosis not present

## 2020-12-11 DIAGNOSIS — K219 Gastro-esophageal reflux disease without esophagitis: Secondary | ICD-10-CM | POA: Diagnosis not present

## 2020-12-11 NOTE — Patient Instructions (Signed)
Continue omeprazole twice a day, 30 minutes before meals.  Chew food well. Avoid steak and tougher textures. If you still have problems swallowing, let us know!  We will talk to Dois Davenport about a screening colonoscopy, then arrange this if she is agreeable.  We will see you in 2-3 months!  Enjoy the movies!  It was a pleasure to see you today. I want to create trusting relationships with patients to provide genuine, compassionate, and quality care. I value your feedback. If you receive a survey regarding your visit,  I greatly appreciate you taking time to fill this out.   Gelene Mink, PhD, ANP-BC [facility]

## 2020-12-11 NOTE — Progress Notes (Signed)
Primary Care Physician:  Kirstie Peri, MD Primary Gastroenterologist:  Dr. Jena Gauss   Chief Complaint  Patient presents with  . Gastroesophageal Reflux    Reports has some issues but thinks it is "behavior related" vomiting    HPI:   Tom Russell is a 50 y.o. male resident of [facility], history of intermittent N/V, EGD on file from 2019. Need screening colonoscopy. GES had been recommended but not completed. Manager of group home present here today to provide additional information.  EGD in 2019 with normal esophagus, single 4 mm semi-sessile polyp in gastric fundus, duodenal bulb normal, s/p biopsy. Fundic polyp, negative H.pylori.   Can't tolerate eggs or chocolate. Avoiding foods that seem to flare vomiting. Last vomiting was 2 weeks ago. PCP seems to think more of a behavioral thing. No reports of abdominal pain. No rectal bleeding. No constipation. Patient states eating beef feels like it gets hung but not chicken. Tolerating hamburger helper.   Omeprazole BID.   Tom Russell is sister, guardian. Would provide consent.   Patient willing to pursue colonoscopy. Will need to discuss with sister, Tom Russell. Overall, patient seems to be doing well with diet and only occasional vomiting seems strictly related to specific foods.    Past Medical History:  Diagnosis Date  . Anxiety   . Cerebral palsy (HCC)   . Depression   . GERD (gastroesophageal reflux disease)   . Mental retardation   . Seizures (HCC)     Past Surgical History:  Procedure Laterality Date  . ESOPHAGOGASTRODUODENOSCOPY (EGD) WITH PROPOFOL N/A 11/23/2017   normal esophagus, single 4 mm semi-sessile polyp in gastric fundus, duodenal bulb normal, s/p biopsy. Fundic polyp, negative H.pylori.   . None to date     as of 11/14/16    Current Outpatient Medications  Medication Sig Dispense Refill  . acetaminophen (TYLENOL) 325 MG tablet Take 650 mg by mouth every 4 (four) hours as needed.    Marland Kitchen alum &  mag hydroxide-simeth (MAALOX/MYLANTA) 200-200-20 MG/5ML suspension Take 10 mLs by mouth every 6 (six) hours as needed for indigestion or heartburn.    . ARIPiprazole (ABILIFY) 5 MG tablet Take 5 mg by mouth every morning.    . bismuth subsalicylate (PEPTO BISMOL) 262 MG/15ML suspension Take 30 mLs by mouth every 6 (six) hours as needed.    . carbamazepine (CARBATROL) 300 MG 12 hr capsule Take by mouth. Takes 2 capsules in the am, 1 capsule at noon, and 2 capsules at bedtime    . cycloSPORINE (RESTASIS) 0.05 % ophthalmic emulsion 1 drop 2 (two) times daily. As needed    . diazepam (VALIUM) 5 MG tablet Take 5 mg by mouth 2 (two) times daily.    . diphenhydrAMINE (BENADRYL) 25 MG tablet Take 25 mg by mouth daily as needed.    . fluvoxaMINE (LUVOX) 100 MG tablet Take 100 mg by mouth 2 (two) times daily.    . hydrocortisone cream 1 % Apply 1 application topically as needed for itching.    . magnesium hydroxide (MILK OF MAGNESIA) 400 MG/5ML suspension Take by mouth daily as needed for mild constipation.    . Olopatadine HCl 0.7 % SOLN Apply to eye daily.     Marland Kitchen omeprazole (PRILOSEC) 20 MG capsule TAKE 1 CAPSULE BY MOUTH TWICE DAILY BEFORE A MEAL 60 capsule 11  . ondansetron (ZOFRAN) 4 MG tablet Take 4 mg by mouth every 6 (six) hours as needed for nausea or vomiting.    Marland Kitchen  Polyethylene Glycol 3350 (GAVILAX PO) Take by mouth daily. 17 grams by mouth daily    . Polyvinyl Alcohol-Povidone PF (REFRESH) 1.4-0.6 % SOLN Apply to eye. As needed    . promethazine (PHENERGAN) 25 MG tablet Take 25 mg by mouth every 8 (eight) hours as needed for nausea or vomiting.    . Sodium Phosphates (FLEET ENEMA) ENEM Place rectally. As needed    . tamsulosin (FLOMAX) 0.4 MG CAPS capsule Take 0.4 mg by mouth.    . triamcinolone (KENALOG) 0.025 % ointment Apply 1 application topically daily. Apply to rash on face    . triamcinolone cream (KENALOG) 0.1 % Apply 1 application topically 2 (two) times daily. Apply to hands    .  zolpidem (AMBIEN) 5 MG tablet Take 5 mg by mouth at bedtime.     No current facility-administered medications for this visit.    Allergies as of 12/11/2020  . (No Known Allergies)    Family History  Family history unknown: Yes    Social History   Socioeconomic History  . Marital status: Single    Spouse name: Not on file  . Number of children: Not on file  . Years of education: Not on file  . Highest education level: Not on file  Occupational History  . Not on file  Tobacco Use  . Smoking status: Never Smoker  . Smokeless tobacco: Never Used  Vaping Use  . Vaping Use: Never used  Substance and Sexual Activity  . Alcohol use: No  . Drug use: No  . Sexual activity: Never    Birth control/protection: None  Other Topics Concern  . Not on file  Social History Narrative  . Not on file   Social Determinants of Health   Financial Resource Strain: Not on file  Food Insecurity: Not on file  Transportation Needs: Not on file  Physical Activity: Not on file  Stress: Not on file  Social Connections: Not on file  Intimate Partner Violence: Not on file    Review of Systems: Gen: Denies any fever, chills, fatigue, weight loss, lack of appetite.  CV: Denies chest pain, heart palpitations, peripheral edema, syncope.  Resp: Denies shortness of breath at rest or with exertion. Denies wheezing or cough.  GI: see HPI GU : Denies urinary burning, urinary frequency, urinary hesitancy MS: Denies joint pain, muscle weakness, cramps, or limitation of movement.  Derm: Denies rash, itching, dry skin Psych: Denies depression, anxiety, memory loss, and confusion Heme: Denies bruising, bleeding, and enlarged lymph nodes.  Physical Exam: BP 128/86   Pulse 84   Temp (!) 97.3 F (36.3 C)   Ht 5\' 4"  (1.626 m)   BMI 26.61 kg/m  General:   Alert and oriented. Pleasant and cooperative. Well-nourished and well-developed.  Head:  Normocephalic and atraumatic. Eyes:  Without icterus,  sclera clear and conjunctiva pink.  Ears:  Normal auditory acuity. Mouth:  Mask in place Lungs:  Clear to auscultation bilaterally.  Heart:  S1, S2 present without murmurs appreciated.  Abdomen:  +BS, soft, non-tender and non-distended. Limited with patient sitting in wheelchair Rectal:  Deferred  Msk:  Weakened lower extremities, consistent with cerebral palsy Extremities:  Without edema. Neurologic:  Alert and  oriented x4 Psych:  Alert and cooperative. Normal mood and affect.  ASSESSMENT: SILAS MUFF is a 50 y.o. male with history of cerebral palsy, mentally handicapped, resident of [facility] but conversant and able to provide brief HPI, with history of intermittent N/V for routine  follow-up and consideration of colonoscopy.  Prior evaluation including EGD in 2019. Manager of home is present today stating overall patient does well with diet and only occasional vomiting that seems diet/behavior related. Patient does note that beef will "hang up" but no other issues with solids, tolerates ground beef, chicken, etc. Will hold off on GES unless worsening. Could pursue BPE as may have motility disorder but at this point will modify diet and pursue further evaluation if worsening.   Due for initial screening colonoscopy. Patient desires to proceed and requests we talk to his sister, Tom Russell, who is his guardian. # [phone]. Will reach out to her for consent.      PLAN: Proceed with colonoscopy by Dr. Jena Gauss in near future using Propofol: the risks, benefits, and alternatives have been discussed with the patient in detail. The patient states understanding and desires to proceed.  Continue omeprazole BID  Avoid steak and tougher textures. Call if worsening.  Return in 2-3 months  Gelene Mink, PhD, [facility]

## 2020-12-16 ENCOUNTER — Encounter: Payer: Self-pay | Admitting: Gastroenterology

## 2020-12-16 NOTE — Progress Notes (Signed)
Cc'ed to pcp °

## 2020-12-17 ENCOUNTER — Telehealth: Payer: Self-pay | Admitting: Gastroenterology

## 2020-12-17 MED ORDER — PEG 3350-KCL-NA BICARB-NACL 420 G PO SOLR: ORAL | 0 refills | Status: DC

## 2020-12-17 NOTE — Telephone Encounter (Signed)
[facility] clinical pool:  I spoke with sister, Tobin Chad ([phone]), who is patient's guardian. She is on board with colonoscopy for patient. I discussed risks and benefits with her. She will be the one present that day and can provide consent.   Please let her know when colonoscopy is scheduled.  Needs screening colonoscopy with Propofol by Dr. Jena Gauss. ASA 2

## 2020-12-17 NOTE — Telephone Encounter (Addendum)
Called patient sister Dois Davenport. She states she can do 4/14 for procedure. Aware endo will call within 5 days of procedure with time. Patient is in [facility]. I advised will also call them to coordinate schedule. Called group home and spoke with Ayana. She states 4/14 for procedure would work. Also aware endo would call within 5 days of procedure with time. She asked for me to mail instructions and send Rx to [facility]. Confirmed mailing address. Spoke with Eber Jones in endo and patient will have rapid test morning of procedure. Endo will call with arrival time.

## 2020-12-17 NOTE — Addendum Note (Signed)
Addended by: Armstead Peaks on: 12/17/2020 09:09 AM   Modules accepted: Orders

## 2020-12-22 DIAGNOSIS — G809 Cerebral palsy, unspecified: Secondary | ICD-10-CM | POA: Diagnosis not present

## 2020-12-22 DIAGNOSIS — Z299 Encounter for prophylactic measures, unspecified: Secondary | ICD-10-CM | POA: Diagnosis not present

## 2020-12-22 DIAGNOSIS — R569 Unspecified convulsions: Secondary | ICD-10-CM | POA: Diagnosis not present

## 2020-12-22 DIAGNOSIS — Z6824 Body mass index (BMI) 24.0-24.9, adult: Secondary | ICD-10-CM | POA: Diagnosis not present

## 2020-12-22 DIAGNOSIS — R079 Chest pain, unspecified: Secondary | ICD-10-CM | POA: Diagnosis not present

## 2020-12-22 DIAGNOSIS — E785 Hyperlipidemia, unspecified: Secondary | ICD-10-CM | POA: Diagnosis not present

## 2020-12-22 DIAGNOSIS — Z789 Other specified health status: Secondary | ICD-10-CM | POA: Diagnosis not present

## 2020-12-24 DIAGNOSIS — Z9181 History of falling: Secondary | ICD-10-CM | POA: Diagnosis not present

## 2020-12-24 DIAGNOSIS — R0789 Other chest pain: Secondary | ICD-10-CM | POA: Diagnosis not present

## 2021-01-20 ENCOUNTER — Telehealth: Payer: Self-pay

## 2021-01-20 ENCOUNTER — Encounter (HOSPITAL_COMMUNITY): Payer: Self-pay | Admitting: Anesthesiology

## 2021-01-20 DIAGNOSIS — G4700 Insomnia, unspecified: Secondary | ICD-10-CM | POA: Diagnosis not present

## 2021-01-20 DIAGNOSIS — Z79899 Other long term (current) drug therapy: Secondary | ICD-10-CM | POA: Diagnosis not present

## 2021-01-20 DIAGNOSIS — R4020 Unspecified coma: Secondary | ICD-10-CM | POA: Diagnosis not present

## 2021-01-20 DIAGNOSIS — R569 Unspecified convulsions: Secondary | ICD-10-CM | POA: Diagnosis not present

## 2021-01-20 DIAGNOSIS — R58 Hemorrhage, not elsewhere classified: Secondary | ICD-10-CM | POA: Diagnosis not present

## 2021-01-20 DIAGNOSIS — R410 Disorientation, unspecified: Secondary | ICD-10-CM | POA: Diagnosis not present

## 2021-01-20 DIAGNOSIS — R404 Transient alteration of awareness: Secondary | ICD-10-CM | POA: Diagnosis not present

## 2021-01-20 DIAGNOSIS — K219 Gastro-esophageal reflux disease without esophagitis: Secondary | ICD-10-CM | POA: Diagnosis not present

## 2021-01-20 NOTE — Telephone Encounter (Signed)
Called caregiver Sharlet Salina and made aware of instructions. Advised he can have clear liquids until midnight and nothing after this point.

## 2021-01-20 NOTE — Telephone Encounter (Signed)
Pts TCS is tomorrow and pt would like to know if he can take all of his medications. The letter mentions it's ok to take heart or BP medication with a sip of water. Please advise. pts caregiver info, Tom Russell [phone].

## 2021-01-21 ENCOUNTER — Encounter (HOSPITAL_COMMUNITY): Admission: RE | Payer: Self-pay | Source: Home / Self Care

## 2021-01-21 ENCOUNTER — Ambulatory Visit (HOSPITAL_COMMUNITY): Admission: RE | Admit: 2021-01-21 | Payer: Medicare Other | Source: Home / Self Care | Admitting: Internal Medicine

## 2021-01-21 ENCOUNTER — Telehealth: Payer: Self-pay | Admitting: Internal Medicine

## 2021-01-21 SURGERY — COLONOSCOPY WITH PROPOFOL
Anesthesia: Monitor Anesthesia Care

## 2021-01-21 NOTE — Telephone Encounter (Signed)
Noted.  Tom Russell, is it ok to reschedule procedure.

## 2021-01-21 NOTE — Telephone Encounter (Signed)
Caregiver and Shawna Orleans both called to let us know that patient had a seizure last night and will not be having his procedure today with Dr Jena Gauss.

## 2021-01-21 NOTE — Telephone Encounter (Signed)
OV MADE °

## 2021-01-21 NOTE — Telephone Encounter (Signed)
Tom Russell, please schedule OV per Tobi Bastos.

## 2021-01-21 NOTE — OR Nursing (Signed)
Patient's sister called and stated patient had a seizure last night and has been admitted. He needs to cancel procedure for today. Darl Pikes at the office notified.

## 2021-01-21 NOTE — Telephone Encounter (Signed)
Needs office visit first ?

## 2021-01-27 DIAGNOSIS — G809 Cerebral palsy, unspecified: Secondary | ICD-10-CM | POA: Diagnosis not present

## 2021-01-27 DIAGNOSIS — R569 Unspecified convulsions: Secondary | ICD-10-CM | POA: Diagnosis not present

## 2021-01-27 DIAGNOSIS — Z299 Encounter for prophylactic measures, unspecified: Secondary | ICD-10-CM | POA: Diagnosis not present

## 2021-01-27 DIAGNOSIS — F79 Unspecified intellectual disabilities: Secondary | ICD-10-CM | POA: Diagnosis not present

## 2021-02-06 DIAGNOSIS — I10 Essential (primary) hypertension: Secondary | ICD-10-CM | POA: Diagnosis not present

## 2021-02-06 DIAGNOSIS — G809 Cerebral palsy, unspecified: Secondary | ICD-10-CM | POA: Diagnosis not present

## 2021-02-09 ENCOUNTER — Ambulatory Visit: Payer: Medicare Other | Admitting: Gastroenterology

## 2021-02-12 ENCOUNTER — Ambulatory Visit: Payer: Medicare Other | Admitting: Gastroenterology

## 2021-02-17 DIAGNOSIS — Z23 Encounter for immunization: Secondary | ICD-10-CM | POA: Diagnosis not present

## 2021-02-25 DIAGNOSIS — F419 Anxiety disorder, unspecified: Secondary | ICD-10-CM | POA: Diagnosis not present

## 2021-03-09 DIAGNOSIS — B351 Tinea unguium: Secondary | ICD-10-CM | POA: Diagnosis not present

## 2021-03-09 DIAGNOSIS — M79676 Pain in unspecified toe(s): Secondary | ICD-10-CM | POA: Diagnosis not present

## 2021-03-23 ENCOUNTER — Encounter: Payer: Self-pay | Admitting: Gastroenterology

## 2021-03-23 ENCOUNTER — Telehealth: Payer: Self-pay | Admitting: *Deleted

## 2021-03-23 ENCOUNTER — Ambulatory Visit (INDEPENDENT_AMBULATORY_CARE_PROVIDER_SITE_OTHER): Payer: Medicare Other | Admitting: Gastroenterology

## 2021-03-23 ENCOUNTER — Other Ambulatory Visit: Payer: Self-pay

## 2021-03-23 VITALS — BP 142/93 | HR 81 | Temp 97.5°F | Ht 64.0 in | Wt 143.2 lb

## 2021-03-23 DIAGNOSIS — R112 Nausea with vomiting, unspecified: Secondary | ICD-10-CM | POA: Diagnosis not present

## 2021-03-23 DIAGNOSIS — Z79899 Other long term (current) drug therapy: Secondary | ICD-10-CM

## 2021-03-23 DIAGNOSIS — G809 Cerebral palsy, unspecified: Secondary | ICD-10-CM

## 2021-03-23 DIAGNOSIS — Z1211 Encounter for screening for malignant neoplasm of colon: Secondary | ICD-10-CM | POA: Diagnosis not present

## 2021-03-23 NOTE — Patient Instructions (Signed)
Plan to do cologuard in place of colonoscopy as screening test.  If cologuard positive will further discuss colonoscopy at that time. If negative, no further testing needed for 3 years unless new symptoms arise  Continue to take omeprazole twice daily.  Follow up in 6 months, or sooner if needed.     It was a pleasure providing care to you today!

## 2021-03-23 NOTE — Progress Notes (Signed)
Referring Provider: Kirstie Peri, MD Primary Care Physician:  Kirstie Peri, MD Primary GI Physician: Dr. Jena Gauss  Chief Complaint  Patient presents with   Gastroesophageal Reflux    Doing ok   Colonoscopy    Guardian isn't comfortable with him having TCS. Wants to know if there are other options    HPI:   Tom Russell is a 50 y.o. male resident of [facility], presenting today with a history of Intermittent n/v, most recent EGD 2019. Patient was scheduled for screening colonoscopy in April of 2022 but had to cancel procedure due to having a seizure the night before. His Legal guardian Tom Russell) does not feel comfortable at this time with proceeding with colonoscopy due to patient's other health complications and inquired about other alternative options.   Intermittent Nausea/Vomiting:   Currently on omeprazole BID, doing well on this. Denies any recent episodes of vomiting. Denies nausea, he is able to tolerate eating without any issues.  patient denies abdominal pain, diarrhea, constipation, bloody or black stools. Denies early satiety or dysphagia, no reflux symptoms. No alarm symptoms present.   Last Colonoscopy:Never, due for screening colonoscopy at this time, however, legal guardian Tom Russell) concerned about patient having colonoscopy due to other comorbidities.   Last Endoscopy: (2019) normal esophagus, single 84mm semi-sessile polyp in gastric fundus, duodenal bulb normal, Fundic polyp, negative H. Pylori.    Recommendations:  Cologuard   Past Medical History:  Diagnosis Date   Anxiety    Cerebral palsy (HCC)    Depression    GERD (gastroesophageal reflux disease)    Mental retardation    Seizures (HCC)     Past Surgical History:  Procedure Laterality Date   ESOPHAGOGASTRODUODENOSCOPY (EGD) WITH PROPOFOL N/A 11/23/2017   normal esophagus, single 4 mm semi-sessile polyp in gastric fundus, duodenal bulb normal, s/p biopsy. Fundic polyp, negative  H.pylori.    None to date     as of 11/14/16    Current Outpatient Medications  Medication Sig Dispense Refill   acetaminophen (TYLENOL) 325 MG tablet Take 650 mg by mouth every 4 (four) hours as needed.     alum & mag hydroxide-simeth (MAALOX/MYLANTA) 200-200-20 MG/5ML suspension Take 10 mLs by mouth every 6 (six) hours as needed for indigestion or heartburn.     ARIPiprazole (ABILIFY) 5 MG tablet Take 5 mg by mouth every morning.     bismuth subsalicylate (PEPTO BISMOL) 262 MG/15ML suspension Take 30 mLs by mouth every 6 (six) hours as needed.     carbamazepine (CARBATROL) 300 MG 12 hr capsule Take 300-600 mg by mouth See admin instructions. Take 2 capsules (600 mg) by mouth in the morning, take 1 capsule (300 mg) by mouth at noon & take 2 capsules (600 mg) by mouth at bedtime.     clotrimazole (LOTRIMIN) 1 % cream Apply 1 application topically 2 (two) times daily as needed (skin irritation).     [employer] SUNSCREEN SPF 30 EX Apply 1 application topically as needed (protect skin for harmful effects of the sun).     cycloSPORINE (RESTASIS) 0.05 % ophthalmic emulsion Place 1 drop into both eyes 2 (two) times daily as needed (dry eyes).     diazepam (VALIUM) 5 MG tablet Take 5 mg by mouth 2 (two) times daily. (0800 & 2000)     Diethyltoluamide (OFF ACTIVE EX) Apply 1 application topically as needed (to prevent bug bites).     diphenhydrAMINE (BENADRYL) 25 MG tablet Take 25 mg by mouth  daily as needed for allergies.     Emollient (VASELINE INTENSIVE CARE EX) Apply 1 application topically as needed (mild to moderate skin dryness).     fluvoxaMINE (LUVOX) 100 MG tablet Take 100 mg by mouth 2 (two) times daily. (0800 & 2000)     gabapentin (NEURONTIN) 800 MG tablet Take 800 mg by mouth in the morning, at noon, in the evening, and at bedtime. (0800, 1200, 1600 & 2000)     GOODSENSE CLEARLAX 17 GM/SCOOP powder Take 17 g by mouth in the morning.     guaifenesin (ROBITUSSIN) 100 MG/5ML syrup Take 100 mg by  mouth every 4 (four) hours as needed for cough.     hydrocortisone cream 1 % Apply 1 application topically 3 (three) times daily as needed for itching.     magnesium hydroxide (MILK OF MAGNESIA) 400 MG/5ML suspension Take by mouth daily as needed for mild constipation.     neomycin-bacitracin-polymyxin (NEOSPORIN) ointment Apply 1 application topically as needed for wound care.     Olopatadine HCl 0.7 % SOLN Place 1 drop into both eyes in the morning.     omeprazole (PRILOSEC) 20 MG capsule TAKE 1 CAPSULE BY MOUTH TWICE DAILY BEFORE A MEAL (Patient taking differently: Take 20 mg by mouth in the morning and at bedtime. (0800 & 1700)) 60 capsule 11   ondansetron (ZOFRAN) 4 MG tablet Take 4 mg by mouth every 6 (six) hours as needed for nausea or vomiting.     Polyvinyl Alcohol-Povidone PF (REFRESH) 1.4-0.6 % SOLN Place 1-2 drops into both eyes 3 (three) times daily as needed (dry/irritated eyes.).     promethazine (PHENERGAN) 25 MG tablet Take 25 mg by mouth every 8 (eight) hours as needed for nausea or vomiting.     Sodium Bicarbonate (AFTER BITE EX) Apply 1 application topically as needed (bug bite/bee stings).     Sodium Phosphates (FLEET ENEMA) ENEM Place 1 each rectally daily as needed (severe constipation).     tamsulosin (FLOMAX) 0.4 MG CAPS capsule Take 0.4 mg by mouth in the morning.     triamcinolone cream (KENALOG) 0.1 % Apply 1 application topically 2 (two) times daily. Apply to hands (0800 & 2000)     zolpidem (AMBIEN) 5 MG tablet Take 5 mg by mouth at bedtime.     polyethylene glycol-electrolytes (NULYTELY) 420 g solution As directed (Patient not taking: Reported on 03/23/2021) 4000 mL 0   No current facility-administered medications for this visit.    Allergies as of 03/23/2021   (No Known Allergies)    Family History  Family history unknown: Yes    Social History   Socioeconomic History   Marital status: Single    Spouse name: Not on file   Number of children: Not on file    Years of education: Not on file   Highest education level: Not on file  Occupational History   Not on file  Tobacco Use   Smoking status: Never   Smokeless tobacco: Never  Vaping Use   Vaping Use: Never used  Substance and Sexual Activity   Alcohol use: No   Drug use: No   Sexual activity: Never    Birth control/protection: None  Other Topics Concern   Not on file  Social History Narrative   Not on file   Social Determinants of Health   Financial Resource Strain: Not on file  Food Insecurity: Not on file  Transportation Needs: Not on file  Physical Activity: Not on file  Stress: Not on file  Social Connections: Not on file    Review of Systems: Gen: Denies fever, chills, anorexia. Denies fatigue, weakness, weight loss.  CV: Denies chest pain, palpitations, syncope, peripheral edema, and claudication. Resp: Denies dyspnea at rest, cough, wheezing, coughing up blood, and pleurisy. GI: Denies vomiting blood, jaundice, and fecal incontinence.   Denies dysphagia or odynophagia. Derm: Denies rash, itching, dry skin Psych: Denies depression, anxiety, memory loss, confusion. No homicidal or suicidal ideation.  Heme: Denies bruising, bleeding, and enlarged lymph nodes.  Physical Exam: BP (!) 142/93   Pulse 81   Temp (!) 97.5 F (36.4 C) (Temporal)   Ht 5\' 4"  (1.626 m)   Wt 143 lb 3.2 oz (65 kg)   BMI 24.58 kg/m  General:   Alert and oriented. No distress noted. Pleasant and cooperative.  Head:  Normocephalic and atraumatic. Mouth:  Oral mucosa pink and moist. Good dentition. No lesions. Abdomen:  +BS, soft, non-tender and non-distended. No rebound or guarding. No HSM or masses noted. Msk:  Symmetrical without gross deformities. Normal posture. Neurologic:  Alert and  oriented x4 Psych:  Alert and cooperative. Normal mood and affect.  ASSESSMENT: Tom Russell is a 50 y.o. male with history cerebral palsy and mental disability, presenting today with [facility] of [facility], where he currently resides. History of intermittent nausea/vomiting, here for routine follow up.  Previous EGD in 2019. Legal guardian requesting alternative to colonoscopy, discussed cologuard with patient and Group home worker who was present at visit and both were in agreement to proceed with this due to its minimally invasive nature.    PLAN:  Continue omeprazole BID with good result 2. Will do cologuard in place of colonoscopy due to legal guardian's concerns regarding pt having procedure. Discussed implications of positive cologuard (would require futher discussion with legal guardian 2020 regarding if she felt like proceeding with colonoscopy) and efficacy rate.   Follow Up: 6 months or sooner if symptoms worsen or new symptoms arise    Tom Russell L. Tom Chad, MSN, APRN, AGNP-C [facility]  Addendum: I was present for visit, exam, discussion, and documentation. Agree with all above.  Jeanmarie Hubert, PhD, ANP-BC [facility]

## 2021-03-23 NOTE — Telephone Encounter (Signed)
Faxed cologuard requisition order to company.

## 2021-03-26 DIAGNOSIS — N390 Urinary tract infection, site not specified: Secondary | ICD-10-CM | POA: Diagnosis not present

## 2021-03-26 DIAGNOSIS — G809 Cerebral palsy, unspecified: Secondary | ICD-10-CM | POA: Diagnosis not present

## 2021-03-26 DIAGNOSIS — F79 Unspecified intellectual disabilities: Secondary | ICD-10-CM | POA: Diagnosis not present

## 2021-03-26 DIAGNOSIS — Z299 Encounter for prophylactic measures, unspecified: Secondary | ICD-10-CM | POA: Diagnosis not present

## 2021-04-06 DIAGNOSIS — Z0000 Encounter for general adult medical examination without abnormal findings: Secondary | ICD-10-CM | POA: Diagnosis not present

## 2021-04-06 DIAGNOSIS — F79 Unspecified intellectual disabilities: Secondary | ICD-10-CM | POA: Diagnosis not present

## 2021-04-06 DIAGNOSIS — Z789 Other specified health status: Secondary | ICD-10-CM | POA: Diagnosis not present

## 2021-04-06 DIAGNOSIS — Z299 Encounter for prophylactic measures, unspecified: Secondary | ICD-10-CM | POA: Diagnosis not present

## 2021-04-06 DIAGNOSIS — Z7189 Other specified counseling: Secondary | ICD-10-CM | POA: Diagnosis not present

## 2021-04-06 DIAGNOSIS — Z6824 Body mass index (BMI) 24.0-24.9, adult: Secondary | ICD-10-CM | POA: Diagnosis not present

## 2021-04-06 DIAGNOSIS — Z1331 Encounter for screening for depression: Secondary | ICD-10-CM | POA: Diagnosis not present

## 2021-04-06 DIAGNOSIS — Z1339 Encounter for screening examination for other mental health and behavioral disorders: Secondary | ICD-10-CM | POA: Diagnosis not present

## 2021-04-06 DIAGNOSIS — G809 Cerebral palsy, unspecified: Secondary | ICD-10-CM | POA: Diagnosis not present

## 2021-04-06 DIAGNOSIS — R569 Unspecified convulsions: Secondary | ICD-10-CM | POA: Diagnosis not present

## 2021-04-07 DIAGNOSIS — E785 Hyperlipidemia, unspecified: Secondary | ICD-10-CM | POA: Diagnosis not present

## 2021-04-07 DIAGNOSIS — R5383 Other fatigue: Secondary | ICD-10-CM | POA: Diagnosis not present

## 2021-04-07 DIAGNOSIS — Z789 Other specified health status: Secondary | ICD-10-CM | POA: Diagnosis not present

## 2021-05-31 DIAGNOSIS — Z79899 Other long term (current) drug therapy: Secondary | ICD-10-CM | POA: Diagnosis not present

## 2021-06-01 DIAGNOSIS — F419 Anxiety disorder, unspecified: Secondary | ICD-10-CM | POA: Diagnosis not present

## 2021-06-15 DIAGNOSIS — Z1212 Encounter for screening for malignant neoplasm of rectum: Secondary | ICD-10-CM | POA: Diagnosis not present

## 2021-06-15 DIAGNOSIS — Z1211 Encounter for screening for malignant neoplasm of colon: Secondary | ICD-10-CM | POA: Diagnosis not present

## 2021-06-16 LAB — COLOGUARD

## 2021-07-06 DIAGNOSIS — M79676 Pain in unspecified toe(s): Secondary | ICD-10-CM | POA: Diagnosis not present

## 2021-07-06 DIAGNOSIS — B351 Tinea unguium: Secondary | ICD-10-CM | POA: Diagnosis not present

## 2021-07-09 DIAGNOSIS — I10 Essential (primary) hypertension: Secondary | ICD-10-CM | POA: Diagnosis not present

## 2021-07-09 DIAGNOSIS — E7800 Pure hypercholesterolemia, unspecified: Secondary | ICD-10-CM | POA: Diagnosis not present

## 2021-07-16 DIAGNOSIS — Z299 Encounter for prophylactic measures, unspecified: Secondary | ICD-10-CM | POA: Diagnosis not present

## 2021-07-16 DIAGNOSIS — R569 Unspecified convulsions: Secondary | ICD-10-CM | POA: Diagnosis not present

## 2021-07-16 DIAGNOSIS — F79 Unspecified intellectual disabilities: Secondary | ICD-10-CM | POA: Diagnosis not present

## 2021-07-16 DIAGNOSIS — G809 Cerebral palsy, unspecified: Secondary | ICD-10-CM | POA: Diagnosis not present

## 2021-07-16 DIAGNOSIS — R609 Edema, unspecified: Secondary | ICD-10-CM | POA: Diagnosis not present

## 2021-07-28 ENCOUNTER — Telehealth: Payer: Self-pay | Admitting: Gastroenterology

## 2021-07-28 NOTE — Telephone Encounter (Signed)
The number  listed for the sister has been disconnected and the other phone number is the wrong number. Please advise

## 2021-07-28 NOTE — Telephone Encounter (Signed)
Cologuard negative. Will be scanned into chart.   Dena: please let legal guardian know Tobin Chad).

## 2021-08-02 DIAGNOSIS — M25531 Pain in right wrist: Secondary | ICD-10-CM | POA: Diagnosis not present

## 2021-08-02 DIAGNOSIS — M778 Other enthesopathies, not elsewhere classified: Secondary | ICD-10-CM | POA: Diagnosis not present

## 2021-08-02 NOTE — Telephone Encounter (Signed)
Tobin Chad [phone]). I had to look at an old note. I updated in contacts.

## 2021-08-02 NOTE — Telephone Encounter (Signed)
Phoned and advised the pt's sister of the pt's cologuard test being negative.

## 2021-08-17 DIAGNOSIS — G40309 Generalized idiopathic epilepsy and epileptic syndromes, not intractable, without status epilepticus: Secondary | ICD-10-CM | POA: Diagnosis not present

## 2021-08-17 DIAGNOSIS — G808 Other cerebral palsy: Secondary | ICD-10-CM | POA: Diagnosis not present

## 2021-08-17 DIAGNOSIS — I6930 Unspecified sequelae of cerebral infarction: Secondary | ICD-10-CM | POA: Diagnosis not present

## 2021-08-17 DIAGNOSIS — F71 Moderate intellectual disabilities: Secondary | ICD-10-CM | POA: Diagnosis not present

## 2021-08-17 DIAGNOSIS — Z79899 Other long term (current) drug therapy: Secondary | ICD-10-CM | POA: Diagnosis not present

## 2021-08-18 ENCOUNTER — Encounter: Payer: Self-pay | Admitting: Gastroenterology

## 2021-08-19 DIAGNOSIS — Z23 Encounter for immunization: Secondary | ICD-10-CM | POA: Diagnosis not present

## 2021-08-20 DIAGNOSIS — U071 COVID-19: Secondary | ICD-10-CM | POA: Diagnosis not present

## 2021-08-27 DIAGNOSIS — F419 Anxiety disorder, unspecified: Secondary | ICD-10-CM | POA: Diagnosis not present

## 2021-09-08 DIAGNOSIS — I10 Essential (primary) hypertension: Secondary | ICD-10-CM | POA: Diagnosis not present

## 2021-09-08 DIAGNOSIS — E7800 Pure hypercholesterolemia, unspecified: Secondary | ICD-10-CM | POA: Diagnosis not present

## 2021-09-22 ENCOUNTER — Encounter: Payer: Self-pay | Admitting: Gastroenterology

## 2021-09-22 ENCOUNTER — Other Ambulatory Visit: Payer: Self-pay

## 2021-09-22 ENCOUNTER — Ambulatory Visit (INDEPENDENT_AMBULATORY_CARE_PROVIDER_SITE_OTHER): Payer: Medicare Other | Admitting: Gastroenterology

## 2021-09-22 VITALS — BP 111/74 | HR 84 | Temp 97.7°F | Ht 64.0 in | Wt 155.0 lb

## 2021-09-22 DIAGNOSIS — K219 Gastro-esophageal reflux disease without esophagitis: Secondary | ICD-10-CM | POA: Diagnosis not present

## 2021-09-22 NOTE — Progress Notes (Signed)
Referring Provider: Kirstie Peri, MD Primary Care Physician:  Kirstie Peri, MD Primary GI: Dr. Jena Gauss   Chief Complaint  Patient presents with   vomiting    Doing  ok    HPI:   Tom Russell is a 50 y.o. male  resident of [facility], presenting today with a history of intermittent n/v, most recent EGD 2019. Patient was scheduled for screening colonoscopy in April of 2022 but had to cancel procedure due to having a seizure the night before. His Legal guardian Tobin Chad) does not feel comfortable at this time with proceeding with colonoscopy due to patient's other health complications and inquired about other alternative options. Cologuard completed and negative.   Manager of [facility] is present today. States they found that the patient would only vomit with a certain staff member; they have worked together on this and vomiting has resolved. Denies any abdominal pain, N/V, constipation, diarrhea. Good appetite. No dysphagia. Manager states no issues currently.     Past Medical History:  Diagnosis Date   Anxiety    Cerebral palsy (HCC)    Depression    GERD (gastroesophageal reflux disease)    Mental retardation    Seizures (HCC)     Past Surgical History:  Procedure Laterality Date   ESOPHAGOGASTRODUODENOSCOPY (EGD) WITH PROPOFOL N/A 11/23/2017   normal esophagus, single 4 mm semi-sessile polyp in gastric fundus, duodenal bulb normal, s/p biopsy. Fundic polyp, negative H.pylori.    None to date     as of 11/14/16    Current Outpatient Medications  Medication Sig Dispense Refill   acetaminophen (TYLENOL) 325 MG tablet Take 650 mg by mouth every 4 (four) hours as needed.     alum & mag hydroxide-simeth (MAALOX/MYLANTA) 200-200-20 MG/5ML suspension Take 10 mLs by mouth every 6 (six) hours as needed for indigestion or heartburn.     ARIPiprazole (ABILIFY) 5 MG tablet Take 5 mg by mouth every morning.     bismuth subsalicylate (PEPTO BISMOL) 262 MG/15ML  suspension Take 30 mLs by mouth every 6 (six) hours as needed.     carbamazepine (CARBATROL) 300 MG 12 hr capsule Take 300 mg by mouth 3 (three) times daily.     clotrimazole (LOTRIMIN) 1 % cream Apply 1 application topically 2 (two) times daily as needed (skin irritation).     [employer] SUNSCREEN SPF 30 EX Apply 1 application topically as needed (protect skin for harmful effects of the sun).     diazepam (VALIUM) 5 MG tablet Take 5 mg by mouth 2 (two) times daily. (0800 & 2000)     Diethyltoluamide (OFF ACTIVE EX) Apply 1 application topically as needed (to prevent bug bites).     diphenhydrAMINE (BENADRYL) 25 MG tablet Take 25 mg by mouth daily as needed for allergies.     Emollient (VASELINE INTENSIVE CARE EX) Apply 1 application topically as needed (mild to moderate skin dryness).     fluvoxaMINE (LUVOX) 100 MG tablet Take 100 mg by mouth 2 (two) times daily. (0800 & 2000)     gabapentin (NEURONTIN) 800 MG tablet Take 800 mg by mouth in the morning, at noon, in the evening, and at bedtime. (0800, 1200, 1600 & 2000)     GOODSENSE CLEARLAX 17 GM/SCOOP powder Take 17 g by mouth in the morning.     guaifenesin (ROBITUSSIN) 100 MG/5ML syrup Take 100 mg by mouth every 4 (four) hours as needed for cough.     hydrocortisone  cream 1 % Apply 1 application topically 3 (three) times daily as needed for itching.     magnesium hydroxide (MILK OF MAGNESIA) 400 MG/5ML suspension Take by mouth daily as needed for mild constipation.     neomycin-bacitracin-polymyxin (NEOSPORIN) ointment Apply 1 application topically as needed for wound care.     Olopatadine HCl 0.7 % SOLN Place 1 drop into both eyes in the morning.     omeprazole (PRILOSEC) 20 MG capsule TAKE 1 CAPSULE BY MOUTH TWICE DAILY BEFORE A MEAL (Patient taking differently: Take 20 mg by mouth in the morning and at bedtime. (0800 & 1700)) 60 capsule 11   Polyvinyl Alcohol-Povidone PF (REFRESH) 1.4-0.6 % SOLN Place 1-2 drops into both eyes 3 (three) times daily  as needed (dry/irritated eyes.).     promethazine (PHENERGAN) 25 MG tablet Take 25 mg by mouth every 8 (eight) hours as needed for nausea or vomiting.     Sodium Bicarbonate (AFTER BITE EX) Apply 1 application topically as needed (bug bite/bee stings).     Sodium Phosphates (FLEET ENEMA) ENEM Place 1 each rectally daily as needed (severe constipation).     tamsulosin (FLOMAX) 0.4 MG CAPS capsule Take 0.4 mg by mouth in the morning.     triamcinolone cream (KENALOG) 0.1 % Apply 1 application topically 2 (two) times daily. Apply to hands (0800 & 2000)     zolpidem (AMBIEN) 5 MG tablet Take 5 mg by mouth at bedtime.     ondansetron (ZOFRAN) 4 MG tablet Take 4 mg by mouth every 6 (six) hours as needed for nausea or vomiting.     No current facility-administered medications for this visit.    Allergies as of 09/22/2021   (No Known Allergies)    Family History  Family history unknown: Yes    Social History   Socioeconomic History   Marital status: Single    Spouse name: Not on file   Number of children: Not on file   Years of education: Not on file   Highest education level: Not on file  Occupational History   Not on file  Tobacco Use   Smoking status: Never   Smokeless tobacco: Never  Vaping Use   Vaping Use: Never used  Substance and Sexual Activity   Alcohol use: No   Drug use: No   Sexual activity: Never    Birth control/protection: None  Other Topics Concern   Not on file  Social History Narrative   Not on file   Social Determinants of Health   Financial Resource Strain: Not on file  Food Insecurity: Not on file  Transportation Needs: Not on file  Physical Activity: Not on file  Stress: Not on file  Social Connections: Not on file    Review of Systems: Gen: Denies fever, chills, anorexia. Denies fatigue, weakness, weight loss.  CV: Denies chest pain, palpitations, syncope, peripheral edema, and claudication. Resp: Denies dyspnea at rest, cough, wheezing,  coughing up blood, and pleurisy. GI: see HPI Derm: Denies rash, itching, dry skin Psych: Denies depression, anxiety, memory loss, confusion. No homicidal or suicidal ideation.  Heme: Denies bruising, bleeding, and enlarged lymph nodes.  Physical Exam: BP 111/74    Pulse 84    Temp 97.7 F (36.5 C) (Temporal)    Ht 5\' 4"  (1.626 m)    Wt 155 lb (70.3 kg) Comment: stated by caregiver   BMI 26.61 kg/m  General:   Alert and oriented. No distress noted. Pleasant and cooperative.  Head:  Normocephalic and atraumatic. Eyes:  Conjuctiva clear without scleral icterus. Mouth: mask in place Abdomen:  +BS, soft, non-tender and non-distended. No rebound or guarding. No HSM or masses noted. Msk:  in whelechair Extremities:  Without edema. Neurologic:  Alert and  oriented x4 Psych:  Alert and cooperative. Normal mood and affect.  ASSESSMENT/PLAN: JADAN HINOJOS is a 50 y.o. male presenting today with prior history of N/V that appears behavior-related after further discussion, now resolved. Chronic GERD well-managed on omeprazole BID.   No prior colonoscopy. Sister has desired to hold off on this currently. Cologuard was negative. Recommend considering colonoscopy in future if sister willing.   Otherwise, we will see him back in 1 year.   Gelene Mink, PhD, ANP-BC [facility]

## 2021-09-22 NOTE — Patient Instructions (Signed)
We will see you back in 1 year!  Please call if any further vomiting, blood in stool, abdominal pain, weight loss.  I enjoyed seeing you again today! As you know, I value our relationship and want to provide genuine, compassionate, and quality care. I welcome your feedback. If you receive a survey regarding your visit,  I greatly appreciate you taking time to fill this out. See you next time!  Gelene Mink, PhD, ANP-BC [facility]

## 2021-10-05 DIAGNOSIS — M79676 Pain in unspecified toe(s): Secondary | ICD-10-CM | POA: Diagnosis not present

## 2021-10-05 DIAGNOSIS — B351 Tinea unguium: Secondary | ICD-10-CM | POA: Diagnosis not present

## 2021-10-08 DIAGNOSIS — E7800 Pure hypercholesterolemia, unspecified: Secondary | ICD-10-CM | POA: Diagnosis not present

## 2021-11-01 DIAGNOSIS — Z789 Other specified health status: Secondary | ICD-10-CM | POA: Diagnosis not present

## 2021-11-01 DIAGNOSIS — G809 Cerebral palsy, unspecified: Secondary | ICD-10-CM | POA: Diagnosis not present

## 2021-11-01 DIAGNOSIS — Z299 Encounter for prophylactic measures, unspecified: Secondary | ICD-10-CM | POA: Diagnosis not present

## 2021-11-09 DIAGNOSIS — I10 Essential (primary) hypertension: Secondary | ICD-10-CM | POA: Diagnosis not present

## 2021-11-09 DIAGNOSIS — E7800 Pure hypercholesterolemia, unspecified: Secondary | ICD-10-CM | POA: Diagnosis not present

## 2021-11-09 DIAGNOSIS — Z79899 Other long term (current) drug therapy: Secondary | ICD-10-CM | POA: Diagnosis not present

## 2022-01-04 DIAGNOSIS — B351 Tinea unguium: Secondary | ICD-10-CM | POA: Diagnosis not present

## 2022-01-04 DIAGNOSIS — M79676 Pain in unspecified toe(s): Secondary | ICD-10-CM | POA: Diagnosis not present

## 2022-01-05 DIAGNOSIS — W050XXA Fall from non-moving wheelchair, initial encounter: Secondary | ICD-10-CM | POA: Diagnosis not present

## 2022-01-05 DIAGNOSIS — S8012XA Contusion of left lower leg, initial encounter: Secondary | ICD-10-CM | POA: Diagnosis not present

## 2022-01-05 DIAGNOSIS — Z789 Other specified health status: Secondary | ICD-10-CM | POA: Diagnosis not present

## 2022-01-05 DIAGNOSIS — Z299 Encounter for prophylactic measures, unspecified: Secondary | ICD-10-CM | POA: Diagnosis not present

## 2022-02-03 DIAGNOSIS — H5213 Myopia, bilateral: Secondary | ICD-10-CM | POA: Diagnosis not present

## 2022-04-05 DIAGNOSIS — M79676 Pain in unspecified toe(s): Secondary | ICD-10-CM | POA: Diagnosis not present

## 2022-04-05 DIAGNOSIS — B351 Tinea unguium: Secondary | ICD-10-CM | POA: Diagnosis not present

## 2022-04-15 DIAGNOSIS — Z79899 Other long term (current) drug therapy: Secondary | ICD-10-CM | POA: Diagnosis not present

## 2022-04-15 DIAGNOSIS — B351 Tinea unguium: Secondary | ICD-10-CM | POA: Diagnosis not present

## 2022-04-27 DIAGNOSIS — Z79899 Other long term (current) drug therapy: Secondary | ICD-10-CM | POA: Diagnosis not present

## 2022-05-03 DIAGNOSIS — I6931 Attention and concentration deficit following cerebral infarction: Secondary | ICD-10-CM | POA: Diagnosis not present

## 2022-05-03 DIAGNOSIS — Z79899 Other long term (current) drug therapy: Secondary | ICD-10-CM | POA: Diagnosis not present

## 2022-05-16 DIAGNOSIS — R5383 Other fatigue: Secondary | ICD-10-CM | POA: Diagnosis not present

## 2022-05-16 DIAGNOSIS — E785 Hyperlipidemia, unspecified: Secondary | ICD-10-CM | POA: Diagnosis not present

## 2022-05-16 DIAGNOSIS — Z0000 Encounter for general adult medical examination without abnormal findings: Secondary | ICD-10-CM | POA: Diagnosis not present

## 2022-05-16 DIAGNOSIS — Z299 Encounter for prophylactic measures, unspecified: Secondary | ICD-10-CM | POA: Diagnosis not present

## 2022-05-16 DIAGNOSIS — R569 Unspecified convulsions: Secondary | ICD-10-CM | POA: Diagnosis not present

## 2022-05-16 DIAGNOSIS — Z7189 Other specified counseling: Secondary | ICD-10-CM | POA: Diagnosis not present

## 2022-05-17 DIAGNOSIS — R5383 Other fatigue: Secondary | ICD-10-CM | POA: Diagnosis not present

## 2022-05-17 DIAGNOSIS — E785 Hyperlipidemia, unspecified: Secondary | ICD-10-CM | POA: Diagnosis not present

## 2022-05-17 DIAGNOSIS — Z0000 Encounter for general adult medical examination without abnormal findings: Secondary | ICD-10-CM | POA: Diagnosis not present

## 2022-05-17 DIAGNOSIS — Z79899 Other long term (current) drug therapy: Secondary | ICD-10-CM | POA: Diagnosis not present

## 2022-05-18 DIAGNOSIS — R569 Unspecified convulsions: Secondary | ICD-10-CM | POA: Diagnosis not present

## 2022-06-29 DIAGNOSIS — Z23 Encounter for immunization: Secondary | ICD-10-CM | POA: Diagnosis not present

## 2022-06-29 DIAGNOSIS — Z299 Encounter for prophylactic measures, unspecified: Secondary | ICD-10-CM | POA: Diagnosis not present

## 2022-06-29 DIAGNOSIS — G809 Cerebral palsy, unspecified: Secondary | ICD-10-CM | POA: Diagnosis not present

## 2022-06-29 DIAGNOSIS — M1A0421 Idiopathic chronic gout, left hand, with tophus (tophi): Secondary | ICD-10-CM | POA: Diagnosis not present

## 2022-06-29 DIAGNOSIS — Z789 Other specified health status: Secondary | ICD-10-CM | POA: Diagnosis not present

## 2022-07-05 DIAGNOSIS — M79676 Pain in unspecified toe(s): Secondary | ICD-10-CM | POA: Diagnosis not present

## 2022-07-05 DIAGNOSIS — B351 Tinea unguium: Secondary | ICD-10-CM | POA: Diagnosis not present

## 2022-07-29 DIAGNOSIS — R5383 Other fatigue: Secondary | ICD-10-CM | POA: Diagnosis not present

## 2022-07-29 DIAGNOSIS — G809 Cerebral palsy, unspecified: Secondary | ICD-10-CM | POA: Diagnosis not present

## 2022-07-29 DIAGNOSIS — R569 Unspecified convulsions: Secondary | ICD-10-CM | POA: Diagnosis not present

## 2022-07-29 DIAGNOSIS — Z299 Encounter for prophylactic measures, unspecified: Secondary | ICD-10-CM | POA: Diagnosis not present

## 2022-08-18 ENCOUNTER — Encounter: Payer: Self-pay | Admitting: Internal Medicine

## 2022-09-06 DIAGNOSIS — I6931 Attention and concentration deficit following cerebral infarction: Secondary | ICD-10-CM | POA: Diagnosis not present

## 2022-09-06 DIAGNOSIS — Z79899 Other long term (current) drug therapy: Secondary | ICD-10-CM | POA: Diagnosis not present

## 2022-10-11 ENCOUNTER — Encounter: Payer: Self-pay | Admitting: Internal Medicine

## 2022-10-11 ENCOUNTER — Ambulatory Visit (INDEPENDENT_AMBULATORY_CARE_PROVIDER_SITE_OTHER): Payer: Medicare Other | Admitting: Internal Medicine

## 2022-10-11 VITALS — BP 127/89 | HR 74 | Temp 97.9°F | Ht 64.0 in | Wt 165.0 lb

## 2022-10-11 DIAGNOSIS — Z1211 Encounter for screening for malignant neoplasm of colon: Secondary | ICD-10-CM

## 2022-10-11 DIAGNOSIS — K219 Gastro-esophageal reflux disease without esophagitis: Secondary | ICD-10-CM | POA: Diagnosis not present

## 2022-10-11 NOTE — Progress Notes (Signed)
Primary Care Physician:  Monico Blitz, MD Primary Gastroenterologist:  Dr. Gala Romney  Pre-Procedure History & Physical: HPI:  Tom Russell is a 52 y.o. male here for follow-up of GERD.  Normal esophagus and fundal gland polyp removed back in 2019.  No Barrett's esophagus.  No evidence of H. pylori.  Takes omeprazole 20 mg for breakfast and a second dose often and at bedtime.  No dysphagia reported.  Was set up to have his first ever colonoscopy 2022 but had a seizure just before the procedure.  Fortunately, he did have a negative Cologuard in 2022.  He denies any bowel symptoms at this time and states bowel function is good.  Past Medical History:  Diagnosis Date   Anxiety    Cerebral palsy (HCC)    Depression    GERD (gastroesophageal reflux disease)    Mental retardation    Seizures ([place])     Past Surgical History:  Procedure Laterality Date   ESOPHAGOGASTRODUODENOSCOPY (EGD) WITH PROPOFOL N/A 11/23/2017   normal esophagus, single 4 mm semi-sessile polyp in gastric fundus, duodenal bulb normal, s/p biopsy. Fundic polyp, negative H.pylori.    None to date     as of 11/14/16    Prior to Admission medications   Medication Sig Start Date End Date Taking? Authorizing Provider  ARIPiprazole (ABILIFY) 5 MG tablet Take 5 mg by mouth every morning.   Yes [provider]  carbamazepine (CARBATROL) 300 MG 12 hr capsule Take 300 mg by mouth 3 (three) times daily.   Yes [provider]  clotrimazole (LOTRIMIN) 1 % cream Apply 1 application topically 2 (two) times daily as needed (skin irritation).   Yes [provider]  diazepam (VALIUM) 5 MG tablet Take 5 mg by mouth 2 (two) times daily. (0800 & 2000)   Yes [provider]  fluvoxaMINE (LUVOX) 100 MG tablet Take 100 mg by mouth 2 (two) times daily. (0800 & 2000)   Yes [provider]  gabapentin (NEURONTIN) 800 MG tablet Take 800 mg by mouth in the morning, at noon, in the evening, and at bedtime.  (0800, 1200, 1600 & 2000)   Yes [provider]  GOODSENSE CLEARLAX 17 GM/SCOOP powder Take 17 g by mouth in the morning. 01/09/21  Yes [provider]  Incontinence Supply Disposable (PREVAIL BOXERS FOR MEN S-M) MISC by Miscellaneous route.   Yes [provider]  Olopatadine HCl 0.7 % SOLN Place 1 drop into both eyes in the morning.   Yes [provider]  omeprazole (PRILOSEC) 20 MG capsule TAKE 1 CAPSULE BY MOUTH TWICE DAILY BEFORE A MEAL Patient taking differently: Take 20 mg by mouth in the morning and at bedtime. (0800 & 1700) 05/06/20  Yes Annitta Needs, NP  tamsulosin (FLOMAX) 0.4 MG CAPS capsule Take 0.4 mg by mouth in the morning.   Yes [provider]  triamcinolone cream (KENALOG) 0.1 % Apply 1 application topically 2 (two) times daily. Apply to hands (0800 & 2000)   Yes [provider]  zolpidem (AMBIEN) 5 MG tablet Take 5 mg by mouth at bedtime.   Yes [provider]    Allergies as of 10/11/2022   (No Known Allergies)    Family History  Family history unknown: Yes    Social History   Socioeconomic History   Marital status: Single    Spouse name: Not on file   Number of children: Not on file   Years of education: Not on file  Highest education level: Not on file  Occupational History   Not on file  Tobacco Use   Smoking status: Never   Smokeless tobacco: Never  Vaping Use   Vaping Use: Never used  Substance and Sexual Activity   Alcohol use: No   Drug use: No   Sexual activity: Never    Birth control/protection: None  Other Topics Concern   Not on file  Social History Narrative   Not on file   Social Determinants of Health   Financial Resource Strain: Not on file  Food Insecurity: Not on file  Transportation Needs: Not on file  Physical Activity: Not on file  Stress: Not on file  Social Connections: Not on file  Intimate Partner Violence: Not on file    Review of Systems: See HPI,  otherwise negative ROS  Physical Exam: Ht 5\' 4"  (1.626 m)   Wt 165 lb (74.8 kg) Comment: Facility reported (08/11/2022)  BMI 28.32 kg/m  General:   Alert,    pleasant and cooperative in NAD confined to the wheelchair.  He has disconjugate gaze.  He is accompanied by his caregiver from [place]. Neck:  Supple; no masses or thyromegaly. No significant cervical adenopathy. Lungs:  Clear throughout to auscultation.   No wheezes, crackles, or rhonchi. No acute distress. Heart:  Regular rate and rhythm; no murmurs, clicks, rubs,  or gallops. Abdomen: Non-distended, normal bowel sounds.  Soft and nontender without appreciable mass or hepatosplenomegaly.  Pulses:  Normal pulses noted. Extremities:  Without clubbing or edema.  Impression/Plan: 52 year old gentleman with CP and GERD.  Presents for follow-up.  He is doing very well on omeprazole 20 mg twice daily.  He ought to take PPI therapy 30 minutes before meals for maximal efficacy.  Overall, his GERD symptoms are well-controlled.  He has no other GI complaints.  Cologuard negative in 2022.  I think Cologuard is the way to go regarding cancer screening moving forward as he would be a difficult colonoscopy prep.   Recommendations:  Continue taking omeprazole 20 mg twice daily (it is best to take this medication before meals -30 minutes before breakfast and supper/not at bedtime)  Will repeat a Cologuard in 2025 for colorectal cancer screening  GERD information provided.  Office visit with [name] in 1 year.  Blood pressure running a little high today.  Will be rechecked.      Notice: This dictation was prepared with Dragon dictation along with smaller phrase technology. Any transcriptional errors that result from this process are unintentional and may not be corrected upon review.

## 2022-10-11 NOTE — Patient Instructions (Signed)
It was good to see you again today!  Continue taking omeprazole 20 mg twice daily (it is best to take this medication before meals -30 minutes before breakfast and supper/not at bedtime)  Will repeat a Cologuard in 2025 for colorectal cancer screening  GERD information provided.  Office visit with [name] in 1 year.  Blood pressure running a little high today.  Will be rechecked.

## 2022-11-03 DIAGNOSIS — G809 Cerebral palsy, unspecified: Secondary | ICD-10-CM | POA: Diagnosis not present

## 2022-11-03 DIAGNOSIS — L602 Onychogryphosis: Secondary | ICD-10-CM | POA: Diagnosis not present

## 2022-11-03 DIAGNOSIS — Z299 Encounter for prophylactic measures, unspecified: Secondary | ICD-10-CM | POA: Diagnosis not present

## 2022-11-03 DIAGNOSIS — D367 Benign neoplasm of other specified sites: Secondary | ICD-10-CM | POA: Diagnosis not present

## 2022-11-16 DIAGNOSIS — Z299 Encounter for prophylactic measures, unspecified: Secondary | ICD-10-CM | POA: Diagnosis not present

## 2022-11-16 DIAGNOSIS — R5383 Other fatigue: Secondary | ICD-10-CM | POA: Diagnosis not present

## 2022-11-16 DIAGNOSIS — K5900 Constipation, unspecified: Secondary | ICD-10-CM | POA: Diagnosis not present

## 2022-11-16 DIAGNOSIS — R569 Unspecified convulsions: Secondary | ICD-10-CM | POA: Diagnosis not present

## 2022-11-16 DIAGNOSIS — G809 Cerebral palsy, unspecified: Secondary | ICD-10-CM | POA: Diagnosis not present

## 2022-11-28 ENCOUNTER — Ambulatory Visit: Payer: 59 | Admitting: Neurology

## 2022-11-28 ENCOUNTER — Encounter: Payer: Self-pay | Admitting: Neurology

## 2022-11-30 DIAGNOSIS — L720 Epidermal cyst: Secondary | ICD-10-CM | POA: Diagnosis not present

## 2022-12-09 DIAGNOSIS — M79675 Pain in left toe(s): Secondary | ICD-10-CM | POA: Diagnosis not present

## 2022-12-09 DIAGNOSIS — M79672 Pain in left foot: Secondary | ICD-10-CM | POA: Diagnosis not present

## 2022-12-09 DIAGNOSIS — M79674 Pain in right toe(s): Secondary | ICD-10-CM | POA: Diagnosis not present

## 2022-12-09 DIAGNOSIS — I739 Peripheral vascular disease, unspecified: Secondary | ICD-10-CM | POA: Diagnosis not present

## 2022-12-09 DIAGNOSIS — L110 Acquired keratosis follicularis: Secondary | ICD-10-CM | POA: Diagnosis not present

## 2022-12-09 DIAGNOSIS — M79671 Pain in right foot: Secondary | ICD-10-CM | POA: Diagnosis not present

## 2022-12-29 ENCOUNTER — Ambulatory Visit: Payer: 59 | Admitting: Neurology

## 2023-01-23 DIAGNOSIS — Z0000 Encounter for general adult medical examination without abnormal findings: Secondary | ICD-10-CM | POA: Diagnosis not present

## 2023-01-23 DIAGNOSIS — E785 Hyperlipidemia, unspecified: Secondary | ICD-10-CM | POA: Diagnosis not present

## 2023-01-23 DIAGNOSIS — Z299 Encounter for prophylactic measures, unspecified: Secondary | ICD-10-CM | POA: Diagnosis not present

## 2023-01-23 DIAGNOSIS — R5383 Other fatigue: Secondary | ICD-10-CM | POA: Diagnosis not present

## 2023-01-23 DIAGNOSIS — Z79899 Other long term (current) drug therapy: Secondary | ICD-10-CM | POA: Diagnosis not present

## 2023-02-09 DIAGNOSIS — I639 Cerebral infarction, unspecified: Secondary | ICD-10-CM | POA: Insufficient documentation

## 2023-02-12 NOTE — Progress Notes (Unsigned)
GI Office Note    Referring Provider: Kirstie Peri, MD Primary Care Physician:  Tom Peri, MD Primary Gastroenterologist: Tom Friends.Rourk, MD   Date:  02/14/2023  ID:  Tom Russell, DOB 10-Jun-1971, MRN [id number]  Chief Complaint   Chief Complaint  Patient presents with   Fecal accidents    Patient here today due to issues with fecal incontinence. Patient says he thought he has seen blood in his stools. Per caregiver they have not seen any blood lately.   History of Present Illness  Tom Russell is a 52 y.o. male with a history of GERD, cerebral palsy, anxiety, depression, and seizures presenting today with complaint of fecal incontinence/urgency and mild rectal bleeding.  EGD 2019: -Normal esophagus -Fundic gland polyp -Pathology negative for Barrett's esophagus or H. pylori  Last office visit 10/31/22.  Presented for follow-up of GERD.  Taking omeprazole 20 mg for breakfast and second dose sometimes at bedtime.  Denied dysphagia.  Previously scheduled for a colonoscopy in 2022 however had a seizure just before procedure so this was canceled.  Negative Cologuard in 2022.  Bowel function reported as good.  Dr. Jena Gauss expressed that Cologuard is best form of screening for colon cancer in his case given he would likely be a difficult colonoscopy prep.  Advised to repeat Cologuard in 2025.  Advised omeprazole twice daily before meals.  Follow-up in 1 year.  Today:  Patient reports when he has bowel movements he is seeing a little blood on the toilet tissue.  Denies hard stools or straining. May be wiping hard at times. Caregiver repots he usually goes at least once per day. No diarrhea. Has been having some incontinence every 1-2 weeks that may occur 1-2 times. No abdominal pain. No rectal pain. Denise history of hemorrhoids. No back pain or fractures that he is aware of. No melena. Incontinence has been occurring for a few months.   Denies, N/V. Has occasional dysphagia. Overall GERD  controlled with PPI.   Current Outpatient Medications  Medication Sig Dispense Refill   ARIPiprazole (ABILIFY) 5 MG tablet Take 5 mg by mouth every morning.     carbamazepine (CARBATROL) 300 MG 12 hr capsule Take 300 mg by mouth 3 (three) times daily.     clotrimazole (LOTRIMIN) 1 % cream Apply 1 application  topically daily at 6 (six) AM.     diazepam (VALIUM) 5 MG tablet Take 5 mg by mouth 2 (two) times daily. (0800 & 2000)     fluvoxaMINE (LUVOX) 100 MG tablet Take 100 mg by mouth 2 (two) times daily. (0800 & 2000)     gabapentin (NEURONTIN) 800 MG tablet Take 800 mg by mouth in the morning, at noon, in the evening, and at bedtime. (0800, 1200, 1600 & 2000)     GOODSENSE CLEARLAX 17 GM/SCOOP powder Take 17 g by mouth in the morning.     hydrochlorothiazide (HYDRODIURIL) 12.5 MG tablet Take 12.5 mg by mouth daily.     Incontinence Supply Disposable (PREVAIL BOXERS FOR MEN S-M) MISC by Miscellaneous route.     loperamide (IMODIUM) 2 MG capsule Take 2 mg by mouth as needed for diarrhea or loose stools. Bid prn     Olopatadine HCl 0.7 % SOLN Place 1 drop into both eyes in the morning.     omeprazole (PRILOSEC) 20 MG capsule TAKE 1 CAPSULE BY MOUTH TWICE DAILY BEFORE A MEAL (Patient taking differently: Take 20 mg by mouth in the morning and at bedtime. (0800 &  1700)) 60 capsule 11   potassium chloride (KLOR-CON) 10 MEQ tablet Take 8 mEq by mouth daily.     tamsulosin (FLOMAX) 0.4 MG CAPS capsule Take 0.4 mg by mouth in the morning.     triamcinolone cream (KENALOG) 0.1 % Apply 1 application topically 2 (two) times daily. Apply to hands (0800 & 2000)     zolpidem (AMBIEN) 5 MG tablet Take 5 mg by mouth at bedtime.     No current facility-administered medications for this visit.    Past Medical History:  Diagnosis Date   Anxiety    Cerebral palsy (HCC)    Depression    GERD (gastroesophageal reflux disease)    Mental retardation    Seizures (HCC)     Past Surgical History:  Procedure  Laterality Date   ESOPHAGOGASTRODUODENOSCOPY (EGD) WITH PROPOFOL N/A 11/23/2017   normal esophagus, single 4 mm semi-sessile polyp in gastric fundus, duodenal bulb normal, s/p biopsy. Fundic polyp, negative H.pylori.    None to date     as of 11/14/16    Family History  Family history unknown: Yes    Allergies as of 02/14/2023   (No Known Allergies)    Social History   Socioeconomic History   Marital status: Single    Spouse name: Not on file   Number of children: Not on file   Years of education: Not on file   Highest education level: Not on file  Occupational History   Not on file  Tobacco Use   Smoking status: Never   Smokeless tobacco: Never  Vaping Use   Vaping Use: Never used  Substance and Sexual Activity   Alcohol use: No   Drug use: No   Sexual activity: Never    Birth control/protection: None  Other Topics Concern   Not on file  Social History Narrative   Not on file   Social Determinants of Health   Financial Resource Strain: Not on file  Food Insecurity: Not on file  Transportation Needs: Not on file  Physical Activity: Not on file  Stress: Not on file  Social Connections: Not on file    Review of Systems   Gen: Denies fever, chills, anorexia. Denies fatigue, weakness, weight loss.  CV: Denies chest pain, palpitations, syncope, peripheral edema, and claudication. Resp: Denies dyspnea at rest, cough, wheezing, coughing up blood, and pleurisy. GI: See HPI Derm: Denies rash, itching, dry skin Psych: Denies depression, anxiety, memory loss, confusion. No homicidal or suicidal ideation.  Heme: Denies bruising, bleeding, and enlarged lymph nodes.  Physical Exam   BP 127/84 (BP Location: Left Arm, Patient Position: Sitting, Cuff Size: Large)   Pulse 81   Temp (!) 97.4 F (36.3 C) (Temporal)   Ht 5\' 4"  (1.626 m)   BMI 28.32 kg/m   General:   Alert and oriented. No distress noted. Pleasant and cooperative.  Head:  Normocephalic and  atraumatic. Eyes:  Conjuctiva clear without scleral icterus. Mouth:  Oral mucosa pink and moist. Good dentition. No lesions. Lungs:  Clear to auscultation bilaterally. No wheezes, rales, or rhonchi. No distress.  Heart:  S1, S2 present without murmurs appreciated.  Abdomen:  +BS, soft, non-tender and non-distended. No rebound or guarding. No HSM or masses noted. Rectal: mildly reduced tone. No evidence of rectal mass. Presence of stool in the rectal vault. Unable to determine internal hemorrhoids. No external hemorrhoids.  Msk:  Symmetrical without gross deformities. Normal posture. Extremities:  Without edema. Neurologic:  Alert and  oriented x4 Psych:  Alert and cooperative. Normal mood and affect.  Assessment  HOLTEN MARZEC is a 52 y.o. male with a history of GERD, cerebral palsy, anxiety, depression, and seizures presenting today for evaluation of fecal incontinence and rectal bleeding.  GERD: Controlled with omeprazole 20 mg twice daily.  Does have some occasional dysphagia but nothing significant.  Denies any nausea or vomiting.  Fecal urgency/incontinence, rectal bleeding: Having some intermittent toilet tissue hematochezia with wiping after bowel movements.  He denies any hard stools or straining.  Typically has a bowel movement at least once per day.  Caregiver reports episodes of incontinence 1-2 times that occurs every 1-2 weeks.  When this occurs patient does not know he has went.  Denies any history of hemorrhoids or back injury although does have cerebral palsy and is contracted.  Incontinence began a few months ago.  Rectal exam today with mildly reduced external tone but presence of stool in the rectal vault, difficult to assess for hemorrhoids.  No obvious rectal mass palpated.  Will trial MiraLAX for possible constipation to see if this improves frequency of incontinence as this could be urgency due to constipation.  If symptoms are ongoing may consider flexible sigmoidoscopy,  will discuss with Dr. Jena Gauss.  PLAN   Continue omeprazole 20 mg twice daily GERD diet Start miralax 17 g nightly. May reduce to 1/2 capful nightly if having more than 3 loose stools daily.  Follow up in 6-8 weeks to assess for ongoing incontinence, may need to perform flexible sigmoidoscopy. Will discuss with Dr. Jena Gauss.   Addendum: Discussed with Dr. Jena Gauss and if no improvement in incontinence or rectal bleeding with miralax then we should trial prepping for colonoscopy for further evaluation.     Brooke Bonito, MSN, FNP-BC, AGACNP-BC [facility]

## 2023-02-13 ENCOUNTER — Ambulatory Visit: Payer: 59 | Admitting: Neurology

## 2023-02-13 ENCOUNTER — Encounter: Payer: Self-pay | Admitting: Neurology

## 2023-02-14 ENCOUNTER — Telehealth: Payer: Self-pay

## 2023-02-14 ENCOUNTER — Encounter: Payer: Self-pay | Admitting: Gastroenterology

## 2023-02-14 ENCOUNTER — Ambulatory Visit (INDEPENDENT_AMBULATORY_CARE_PROVIDER_SITE_OTHER): Payer: 59 | Admitting: Gastroenterology

## 2023-02-14 VITALS — BP 127/84 | HR 81 | Temp 97.4°F | Ht 64.0 in

## 2023-02-14 DIAGNOSIS — R159 Full incontinence of feces: Secondary | ICD-10-CM

## 2023-02-14 DIAGNOSIS — K921 Melena: Secondary | ICD-10-CM

## 2023-02-14 DIAGNOSIS — K219 Gastro-esophageal reflux disease without esophagitis: Secondary | ICD-10-CM | POA: Diagnosis not present

## 2023-02-14 NOTE — Telephone Encounter (Signed)
Please dismiss from practice. Thanks

## 2023-02-14 NOTE — Patient Instructions (Addendum)
Times MiraLAX 17 g or 1 capful nightly.  May reduce to half capful nightly if having more than 3 loose stools daily.   Given stool present in the rectum today, difficult to assess for hemorrhoids however suspect that bleeding is likely secondary to either very small hemorrhoids or from mucosal irritation from wiping too hard.  Continue omeprazole 20 mg twice daily.  We will plan to follow-up in 6-8 weeks.  If still having ongoing issues with incontinence we can consider scheduling a flexible sigmoidoscopy, will discuss this further with Dr. Jena Gauss.  It was a pleasure to see you today. I want to create trusting relationships with patients. If you receive a survey regarding your visit,  I greatly appreciate you taking time to fill this out on paper or through your MyChart. I value your feedback.  Brooke Bonito, MSN, FNP-BC, AGACNP-BC [facility]

## 2023-02-14 NOTE — Telephone Encounter (Signed)
Patient has no showed 3 new patient appointments. 2/19, 3/21 and 5/6. Please advise regarding dismissal due to no show policy.

## 2023-02-15 ENCOUNTER — Encounter: Payer: Self-pay | Admitting: Neurology

## 2023-02-28 DIAGNOSIS — L602 Onychogryphosis: Secondary | ICD-10-CM | POA: Diagnosis not present

## 2023-02-28 DIAGNOSIS — Z299 Encounter for prophylactic measures, unspecified: Secondary | ICD-10-CM | POA: Diagnosis not present

## 2023-02-28 DIAGNOSIS — G809 Cerebral palsy, unspecified: Secondary | ICD-10-CM | POA: Diagnosis not present

## 2023-03-20 DIAGNOSIS — L03011 Cellulitis of right finger: Secondary | ICD-10-CM | POA: Diagnosis not present

## 2023-03-21 DIAGNOSIS — M79672 Pain in left foot: Secondary | ICD-10-CM | POA: Diagnosis not present

## 2023-03-21 DIAGNOSIS — M79674 Pain in right toe(s): Secondary | ICD-10-CM | POA: Diagnosis not present

## 2023-03-21 DIAGNOSIS — Z299 Encounter for prophylactic measures, unspecified: Secondary | ICD-10-CM | POA: Diagnosis not present

## 2023-03-21 DIAGNOSIS — I739 Peripheral vascular disease, unspecified: Secondary | ICD-10-CM | POA: Diagnosis not present

## 2023-03-21 DIAGNOSIS — L609 Nail disorder, unspecified: Secondary | ICD-10-CM | POA: Diagnosis not present

## 2023-03-21 DIAGNOSIS — M79675 Pain in left toe(s): Secondary | ICD-10-CM | POA: Diagnosis not present

## 2023-03-21 DIAGNOSIS — R569 Unspecified convulsions: Secondary | ICD-10-CM | POA: Diagnosis not present

## 2023-03-21 DIAGNOSIS — M79671 Pain in right foot: Secondary | ICD-10-CM | POA: Diagnosis not present

## 2023-03-21 DIAGNOSIS — L110 Acquired keratosis follicularis: Secondary | ICD-10-CM | POA: Diagnosis not present

## 2023-03-21 DIAGNOSIS — G809 Cerebral palsy, unspecified: Secondary | ICD-10-CM | POA: Diagnosis not present

## 2023-03-23 DIAGNOSIS — B351 Tinea unguium: Secondary | ICD-10-CM | POA: Diagnosis not present

## 2023-03-23 DIAGNOSIS — L602 Onychogryphosis: Secondary | ICD-10-CM | POA: Diagnosis not present

## 2023-03-23 DIAGNOSIS — Z299 Encounter for prophylactic measures, unspecified: Secondary | ICD-10-CM | POA: Diagnosis not present

## 2023-04-04 DIAGNOSIS — B379 Candidiasis, unspecified: Secondary | ICD-10-CM | POA: Diagnosis not present

## 2023-04-04 DIAGNOSIS — R21 Rash and other nonspecific skin eruption: Secondary | ICD-10-CM | POA: Diagnosis not present

## 2023-04-04 DIAGNOSIS — G809 Cerebral palsy, unspecified: Secondary | ICD-10-CM | POA: Diagnosis not present

## 2023-04-11 ENCOUNTER — Encounter: Payer: Self-pay | Admitting: Gastroenterology

## 2023-04-11 ENCOUNTER — Ambulatory Visit (INDEPENDENT_AMBULATORY_CARE_PROVIDER_SITE_OTHER): Payer: 59 | Admitting: Gastroenterology

## 2023-04-11 VITALS — BP 122/73 | HR 78 | Temp 97.6°F | Ht 63.0 in | Wt 165.0 lb

## 2023-04-11 DIAGNOSIS — K219 Gastro-esophageal reflux disease without esophagitis: Secondary | ICD-10-CM

## 2023-04-11 DIAGNOSIS — K5900 Constipation, unspecified: Secondary | ICD-10-CM

## 2023-04-11 DIAGNOSIS — K921 Melena: Secondary | ICD-10-CM

## 2023-04-11 DIAGNOSIS — R159 Full incontinence of feces: Secondary | ICD-10-CM

## 2023-04-11 MED ORDER — BENEFIBER PO POWD: ORAL | 1 refills | Status: DC

## 2023-04-11 NOTE — Patient Instructions (Addendum)
We will schedule you for a flexible sigmoidoscopy in the near future with Dr. Jena Gauss.  I have discussed this with your sister/legal guardian. We will provide you with separate detailed written instructions regarding your prep.  Continue to give MiraLAX 1 capful daily as needed.  I recommend starting a daily fiber supplement such as Benefiber or Metamucil.  Plan to follow-up in 3 months, this will be after the procedure.  It was a pleasure to see you today. I want to create trusting relationships with patients. If you receive a survey regarding your visit,  I greatly appreciate you taking time to fill this out on paper or through your MyChart. I value your feedback.  Brooke Bonito, MSN, FNP-BC, AGACNP-BC [facility]

## 2023-04-11 NOTE — Progress Notes (Signed)
GI Office Note    Referring Provider: Kirstie Peri, MD Primary Care Physician:  Kirstie Peri, MD Primary Gastroenterologist: Gerrit Friends.Rourk, MD  Date:  04/11/2023  ID:  Tom Russell, DOB [birth year], MRN [id number]   Chief Complaint   Chief Complaint  Patient presents with   Follow-up    Follow up still having problems with stomach and having bowel movements    History of Present Illness  Tom Russell is a 52 y.o. male with a history of GERD, CP, anxiety, depression, and seizures presenting today for follow-up of fecal incontinence/urgency and rectal bleeding.  EGD 2019: -Normal esophagus -Fundic gland polyp -Pathology negative for Barrett's esophagus or H. pylori   Office visit 10/31/22.  Presented for follow-up of GERD.  Taking omeprazole 20 mg for breakfast and second dose sometimes at bedtime.  Denied dysphagia.  Previously scheduled for a colonoscopy in 2022 however had a seizure just before procedure so this was canceled.  Negative Cologuard in 2022.  Bowel function reported as good.  Dr. Jena Gauss expressed that Cologuard is best form of screening for colon cancer in his case given he would likely be a difficult colonoscopy prep.  Advised to repeat Cologuard in 2025.  Advised omeprazole twice daily before meals.  Follow-up in 1 year.   Last office visit 02/14/2023.  Patient reported when he has a bowel movement he sees a little blood on toilet tissue.  Denies any hard stools or straining but may be wiping hard at times.  His caregiver reported he goes at least once per day and does not have any diarrhea.  He has had a few incontinence episodes every 1-2 weeks.  Denied any abdominal pain, rectal pain or history of hemorrhoids.  Also denied any melena, nausea, vomiting.  Overall GERD controlled with PPI with some occasional dysphagia.  Advised to start MiraLAX nightly to help with constipation and if no improvement then need to evaluate with a colonoscopy for further evaluation.  Advised  to continue omeprazole 20 mg twice daily.  Today:  Caregivers reports he was having too many loose stools with the miralax. Patient states he is having more accidents. He states he is unable to tell when he has to have a BM. He is having accidents while trying to get on the toilet but not a lot in the underwear. He does have a little bit of abdominal pain in the lower abdomen. He states he has a good appetite. Miralax has bene placed on prn given too many looser stools. He talked to his sister about a colonoscopy but states his sister didn't say anything about it.   No nausea or vomiting. Doing okay in regards to swallowing.  GERD controlled.   Spoke with sister and the year before last when he did not eat he wound up with seizures and in the hospital given he was only taking in clear liquids all day.   Current Outpatient Medications  Medication Sig Dispense Refill   ARIPiprazole (ABILIFY) 5 MG tablet Take 5 mg by mouth every morning.     carbamazepine (CARBATROL) 300 MG 12 hr capsule Take 300 mg by mouth 3 (three) times daily.     clotrimazole (LOTRIMIN) 1 % cream Apply 1 application  topically daily at 6 (six) AM.     diazepam (VALIUM) 5 MG tablet Take 5 mg by mouth 2 (two) times daily. (0800 & 2000)     fluvoxaMINE (LUVOX) 100 MG tablet Take 100 mg by mouth 2 (two) times  daily. (0800 & 2000)     gabapentin (NEURONTIN) 800 MG tablet Take 800 mg by mouth in the morning, at noon, in the evening, and at bedtime. (0800, 1200, 1600 & 2000)     GOODSENSE CLEARLAX 17 GM/SCOOP powder Take 17 g by mouth in the morning.     hydrochlorothiazide (HYDRODIURIL) 12.5 MG tablet Take 12.5 mg by mouth daily.     Incontinence Supply Disposable (PREVAIL BOXERS FOR MEN S-M) MISC by Miscellaneous route.     loperamide (IMODIUM) 2 MG capsule Take 2 mg by mouth as needed for diarrhea or loose stools. Bid prn     Olopatadine HCl 0.7 % SOLN Place 1 drop into both eyes in the morning.     omeprazole (PRILOSEC) 20 MG  capsule TAKE 1 CAPSULE BY MOUTH TWICE DAILY BEFORE A MEAL (Patient taking differently: Take 20 mg by mouth in the morning and at bedtime. (0800 & 1700)) 60 capsule 11   potassium chloride (KLOR-CON) 10 MEQ tablet Take 8 mEq by mouth daily.     tamsulosin (FLOMAX) 0.4 MG CAPS capsule Take 0.4 mg by mouth in the morning.     triamcinolone cream (KENALOG) 0.1 % Apply 1 application topically 2 (two) times daily. Apply to hands (0800 & 2000)     zolpidem (AMBIEN) 5 MG tablet Take 5 mg by mouth at bedtime.     No current facility-administered medications for this visit.    Past Medical History:  Diagnosis Date   Anxiety    Cerebral palsy (HCC)    Depression    GERD (gastroesophageal reflux disease)    Mental retardation    Seizures (HCC)     Past Surgical History:  Procedure Laterality Date   ESOPHAGOGASTRODUODENOSCOPY (EGD) WITH PROPOFOL N/A 11/23/2017   normal esophagus, single 4 mm semi-sessile polyp in gastric fundus, duodenal bulb normal, s/p biopsy. Fundic polyp, negative H.pylori.    None to date     as of 11/14/16    Family History  Family history unknown: Yes    Allergies as of 04/11/2023   (No Known Allergies)    Social History   Socioeconomic History   Marital status: Single    Spouse name: Not on file   Number of children: Not on file   Years of education: Not on file   Highest education level: Not on file  Occupational History   Not on file  Tobacco Use   Smoking status: Never   Smokeless tobacco: Never  Vaping Use   Vaping Use: Never used  Substance and Sexual Activity   Alcohol use: No   Drug use: No   Sexual activity: Never    Birth control/protection: None  Other Topics Concern   Not on file  Social History Narrative   Not on file   Social Determinants of Health   Financial Resource Strain: Not on file  Food Insecurity: Not on file  Transportation Needs: Not on file  Physical Activity: Not on file  Stress: Not on file  Social Connections: Not  on file     Review of Systems   Gen: Denies fever, chills, anorexia. Denies fatigue, weakness, weight loss.  CV: Denies chest pain, palpitations, syncope, peripheral edema, and claudication. Resp: Denies dyspnea at rest, cough, wheezing, coughing up blood, and pleurisy. GI: See HPI Derm: Denies rash, itching, dry skin Psych: Denies depression, anxiety, memory loss, confusion. No homicidal or suicidal ideation.  Heme: Denies bruising, bleeding, and enlarged lymph nodes.   Physical Exam  BP 122/73 (BP Location: Left Arm, Patient Position: Sitting, Cuff Size: Normal)   Pulse 78   Temp 97.6 F (36.4 C) (Temporal)   Ht 5\' 3"  (1.6 m)   Wt 165 lb (74.8 kg)   SpO2 94%   BMI 29.23 kg/m   General:   Alert and oriented. No distress noted. Pleasant and cooperative.  Head:  Normocephalic and atraumatic. Eyes:  Conjuctiva red without scleral icterus.  Incongruent. Mouth:  Oral mucosa pink and moist. Good dentition. No lesions. Lungs:  Clear to auscultation bilaterally. No wheezes, rales, or rhonchi. No distress.  Heart:  S1, S2 present without murmurs appreciated.  Abdomen:  +BS, soft, non-tender and non-distended. No rebound or guarding. No HSM or masses noted.  Rectal: deferred Msk:  Symmetrical without gross deformities. Normal posture. Extremities:  Without edema. Neurologic:  Alert and  oriented x4 Psych:  Alert and cooperative. Normal mood and affect.  Assessment  Tom Russell is a 52 y.o. male with a history of GERD, CP, anxiety, depression, and seizures presenting today for follow-up of fecal incontinence/urgency and rectal bleeding.  GERD: Well-controlled with omeprazole 20 mg twice daily.  Denies any nausea, vomiting, or dysphagia.  Fecal urgency/incontinence, constipation, rectal bleeding: Previously having limited rectal bleeding but felt as though this is secondary to vigorous wiping.  Previous rectal exam without any obvious hemorrhoids and no evidence of rectal mass.   He did have stool within the rectal vault and likely is suffering from constipation with some urgency and possible overflow of looser stools at times.  Continues to have some incontinence, possibly increased from prior.  Attempted MiraLAX daily but was having increased looser stools so now it is as needed.  We will add fiber supplement daily to his regimen to help with bulk and effort to decrease incontinence.  Previously discussed colonoscopy versus flexible sigmoidoscopy and colonoscopy was recommended however spoke with patient's legal guardian (sister Tobin Chad) and given her concerns with prep and his inability to eat and issues with seizures that occurred during his last prep for full colonoscopy she would like to defer to doing a flexible sigmoidoscopy and we will attempt at a modified prep to allow soft solid food until 2 pm and clear liquids after this only and NPO at midnight with procedure being first thing in the morning.  PLAN   Continue omeprazole 20 mg twice daily GERD diet MiraLAX as needed Start daily fiber supplement such as benefiber or metamucil (1 Tbl) in 8 oz water.  Proceed with flexible with propofol by Dr. Jena Gauss in near future: the risks, benefits, and alternatives have been discussed with the patient in detail. The patient states understanding and desires to proceed. ASA 3 Miralax daily for 4 days prior to flexible sigmoidoscopy Allow soft foods until 2 PM Clear liquids after 2 PM Tapwater enema x 2 morning of procedure Follow up in 3 months.    Brooke Bonito, MSN, FNP-BC, AGACNP-BC [facility]

## 2023-04-12 DIAGNOSIS — L720 Epidermal cyst: Secondary | ICD-10-CM | POA: Diagnosis not present

## 2023-04-12 DIAGNOSIS — B351 Tinea unguium: Secondary | ICD-10-CM | POA: Diagnosis not present

## 2023-04-24 DIAGNOSIS — G809 Cerebral palsy, unspecified: Secondary | ICD-10-CM | POA: Diagnosis not present

## 2023-04-24 DIAGNOSIS — L602 Onychogryphosis: Secondary | ICD-10-CM | POA: Diagnosis not present

## 2023-04-24 DIAGNOSIS — R569 Unspecified convulsions: Secondary | ICD-10-CM | POA: Diagnosis not present

## 2023-04-24 DIAGNOSIS — Z299 Encounter for prophylactic measures, unspecified: Secondary | ICD-10-CM | POA: Diagnosis not present

## 2023-04-25 ENCOUNTER — Telehealth: Payer: Self-pay | Admitting: *Deleted

## 2023-04-25 NOTE — Telephone Encounter (Signed)
Called and spoke with Tom Russell with group home. She tentativly scheduled flex sig with Dr. Jena Gauss ASA 3 on 8/14 but wants to discuss with sister 1st. Will call back and confirm once done.

## 2023-04-26 ENCOUNTER — Encounter: Payer: Self-pay | Admitting: *Deleted

## 2023-04-26 NOTE — Telephone Encounter (Signed)
Tom Russell called back and confirmed appt for procedure on 05/24/23. She will come by tomorrow and pick up instructions. Will place instructions up front .

## 2023-04-27 ENCOUNTER — Encounter: Payer: Self-pay | Admitting: *Deleted

## 2023-05-08 DIAGNOSIS — L602 Onychogryphosis: Secondary | ICD-10-CM | POA: Diagnosis not present

## 2023-05-08 DIAGNOSIS — S61304A Unspecified open wound of right ring finger with damage to nail, initial encounter: Secondary | ICD-10-CM | POA: Diagnosis not present

## 2023-05-08 DIAGNOSIS — B351 Tinea unguium: Secondary | ICD-10-CM | POA: Diagnosis not present

## 2023-05-08 DIAGNOSIS — S61310A Laceration without foreign body of right index finger with damage to nail, initial encounter: Secondary | ICD-10-CM | POA: Diagnosis not present

## 2023-05-08 DIAGNOSIS — M79605 Pain in left leg: Secondary | ICD-10-CM | POA: Diagnosis not present

## 2023-05-08 DIAGNOSIS — M79604 Pain in right leg: Secondary | ICD-10-CM | POA: Diagnosis not present

## 2023-05-08 DIAGNOSIS — Z299 Encounter for prophylactic measures, unspecified: Secondary | ICD-10-CM | POA: Diagnosis not present

## 2023-05-08 DIAGNOSIS — S61314A Laceration without foreign body of right ring finger with damage to nail, initial encounter: Secondary | ICD-10-CM | POA: Diagnosis not present

## 2023-05-10 ENCOUNTER — Encounter: Payer: Self-pay | Admitting: Neurology

## 2023-05-17 NOTE — Patient Instructions (Signed)
Tom Russell  05/17/2023     @PREFPERIOPPHARMACY @   Your procedure is scheduled on  05/24/2023.   Report to [name] at  0730  A.M.   Call this number if you have problems the morning of surgery:  [phone]  If you experience any cold or flu symptoms such as cough, fever, chills, shortness of breath, etc. between now and your scheduled surgery, please notify us at the above number.   Remember:  Follow the diet and prep instructions given to you by the office.     Take these medicines the morning of surgery with A SIP OF WATER       abilify, carbamazepine, valium (if needed), luvox, gabapentin, omeprazole, flomax.     Do not wear jewelry, make-up or nail polish, including gel polish,  artificial nails, or any other type of covering on natural nails (fingers and  toes).  Do not wear lotions, powders, or perfumes, or deodorant.  Do not shave 48 hours prior to surgery.  Men may shave face and neck.  Do not bring valuables to the hospital.  [facility] is not responsible for any belongings or valuables.  Contacts, dentures or bridgework may not be worn into surgery.  Leave your suitcase in the car.  After surgery it may be brought to your room.  For patients admitted to the hospital, discharge time will be determined by your treatment team.  Patients discharged the day of surgery will not be allowed to drive home and must have someone with them for 24 hours.    Special instructions:   DO NOT smoke tobacco or vape for 24 hours before your procedure.  Please read over the following fact sheets that you were given. Anesthesia Post-op Instructions and Care and Recovery After Surgery       Flexible Sigmoidoscopy, Care After The following information offers guidance on how to care for yourself after your procedure. Your health care provider may also give you more specific instructions. If you have problems or questions, contact your health care provider. What can I  expect after the procedure? After the procedure, it is common to have: Cramping or pain in your abdomen. Bloating. A small amount of blood with your stool. This may happen if a sample of tissue was removed for testing (biopsy). Follow these instructions at home: Eating and drinking Drink enough fluid to keep your urine pale yellow. Follow instructions from your health care provider about what you may eat and drink. Go back to your normal diet as told by your health care provider. Avoid heavy or fried foods. These may be hard to digest. Activity  If you were given a sedative during the procedure, it can affect you for several hours. Do not drive or operate machinery until your health care provider says that it is safe. Rest as told by your health care provider. Do not sit for a long time without moving. Get up to take short walks every 1-2 hours. This will improve blood flow and breathing. Ask for help if you feel weak or unsteady. Return to your normal activities as told by your health care provider. Ask your health care provider what activities are safe for you. General instructions Take over-the-counter and prescription medicines only as told by your health care provider. Try walking around when you have cramps or feel bloated. Contact a health care provider if: You have pain or cramping in your abdomen that gets worse or is not  helped with medicine. You have a small amount of bleeding from your rectum for more than 24 hours after your procedure. You have nausea or vomiting. You feel weak or dizzy. You have a fever. Get help right away if: You pass large blood clots or see a large amount of blood in the toilet after having a bowel movement. You have severe pain in your abdomen. This information is not intended to replace advice given to you by your health care provider. Make sure you discuss any questions you have with your health care provider. Document Revised: 03/02/2022 Document  Reviewed: 03/02/2022 Elsevier Patient Education  2024 [employer] Monitored Anesthesia Care, Care After The following information offers guidance on how to care for yourself after your procedure. Your health care provider may also give you more specific instructions. If you have problems or questions, contact your health care provider. What can I expect after the procedure? After the procedure, it is common to have: Tiredness. Little or no memory about what happened during or after the procedure. Impaired judgment when it comes to making decisions. Nausea or vomiting. Some trouble with balance. Follow these instructions at home: For the time period you were told by your health care provider:  Rest. Do not participate in activities where you could fall or become injured. Do not drive or use machinery. Do not drink alcohol. Do not take sleeping pills or medicines that cause drowsiness. Do not make important decisions or sign legal documents. Do not take care of children on your own. Medicines Take over-the-counter and prescription medicines only as told by your health care provider. If you were prescribed antibiotics, take them as told by your health care provider. Do not stop using the antibiotic even if you start to feel better. Eating and drinking Follow instructions from your health care provider about what you may eat and drink. Drink enough fluid to keep your urine pale yellow. If you vomit: Drink clear fluids slowly and in small amounts as you are able. Clear fluids include water, ice chips, low-calorie sports drinks, and fruit juice that has water added to it (diluted fruit juice). Eat light and bland foods in small amounts as you are able. These foods include bananas, applesauce, rice, lean meats, toast, and crackers. General instructions  Have a responsible adult stay with you for the time you are told. It is important to have someone help care for you until you are awake  and alert. If you have sleep apnea, surgery and some medicines can increase your risk for breathing problems. Follow instructions from your health care provider about wearing your sleep device: When you are sleeping. This includes during daytime naps. While taking prescription pain medicines, sleeping medicines, or medicines that make you drowsy. Do not use any products that contain nicotine or tobacco. These products include cigarettes, chewing tobacco, and vaping devices, such as e-cigarettes. If you need help quitting, ask your health care provider. Contact a health care provider if: You feel nauseous or vomit every time you eat or drink. You feel light-headed. You are still sleepy or having trouble with balance after 24 hours. You get a rash. You have a fever. You have redness or swelling around the IV site. Get help right away if: You have trouble breathing. You have new confusion after you get home. These symptoms may be an emergency. Get help right away. Call 911. Do not wait to see if the symptoms will go away. Do not drive yourself to the hospital.  This information is not intended to replace advice given to you by your health care provider. Make sure you discuss any questions you have with your health care provider. Document Revised: 02/21/2022 Document Reviewed: 02/21/2022 Elsevier Patient Education  2024 [employer].

## 2023-05-22 ENCOUNTER — Telehealth: Payer: Self-pay | Admitting: Internal Medicine

## 2023-05-22 ENCOUNTER — Encounter (HOSPITAL_COMMUNITY): Payer: Self-pay

## 2023-05-22 ENCOUNTER — Encounter (HOSPITAL_COMMUNITY)
Admission: RE | Admit: 2023-05-22 | Discharge: 2023-05-22 | Disposition: A | Discharge status: Home / Self Care | Payer: 59 | Source: Ambulatory Visit | Attending: Internal Medicine | Admitting: Internal Medicine

## 2023-05-22 DIAGNOSIS — I10 Essential (primary) hypertension: Secondary | ICD-10-CM

## 2023-05-22 DIAGNOSIS — Z79899 Other long term (current) drug therapy: Secondary | ICD-10-CM

## 2023-05-22 NOTE — Pre-Procedure Instructions (Signed)
Office messaged because patient did not show for pre-op.

## 2023-05-22 NOTE — Telephone Encounter (Signed)
LMTRC

## 2023-05-22 NOTE — Telephone Encounter (Signed)
no show Received: Today Elsie Amis, RN  Estudillo, Ewell Poe, CMA; Elinor Dodge, LPN; Marlowe Shores, LPN Hey! Tom Russell did not show for his pre-op today.

## 2023-05-22 NOTE — Telephone Encounter (Signed)
Tom Russell left a message that she has questions about the procedure he is scheduled for.  [phone]

## 2023-05-23 NOTE — Telephone Encounter (Signed)
Message Received: Today Elsie Amis, RN  Tamaya Pun L, LPN He is still on. We will have to do his blood work on arrival because they didn't bring him yesterday. We really didn't want to reschedule him since they had been doing his prep as directed. They didn't seem to know about the pre-op.Marland KitchenMarland Kitchen I dont know if they did or not or they just forgot and didn't want to say..:)..       Ayanah from Group home informed of message and verbalized understanding.

## 2023-05-23 NOTE — Telephone Encounter (Signed)
Spoke with Ayanah from the group home regarding this pt. She says she spoke to someone about pt's procedure in the morning and the enemas he needs. She was told that the enemas would be done at the hospital in the morning and she should have him there around 7:30 am.  Message sent to Maryclare Labrador, RN regarding this procedure.

## 2023-05-24 ENCOUNTER — Encounter (HOSPITAL_COMMUNITY): Payer: Self-pay | Admitting: Internal Medicine

## 2023-05-24 ENCOUNTER — Encounter (HOSPITAL_COMMUNITY)
Admission: RE | Disposition: A | Discharge status: Home / Self Care | Payer: Self-pay | Source: Ambulatory Visit | Attending: Internal Medicine

## 2023-05-24 ENCOUNTER — Ambulatory Visit (HOSPITAL_COMMUNITY): Payer: 59 | Admitting: Certified Registered"

## 2023-05-24 ENCOUNTER — Ambulatory Visit (HOSPITAL_COMMUNITY)
Admission: RE | Admit: 2023-05-24 | Discharge: 2023-05-24 | Disposition: A | Discharge status: Home / Self Care | Payer: 59 | Source: Ambulatory Visit | Attending: Internal Medicine | Admitting: Internal Medicine

## 2023-05-24 ENCOUNTER — Ambulatory Visit (HOSPITAL_BASED_OUTPATIENT_CLINIC_OR_DEPARTMENT_OTHER): Payer: 59 | Admitting: Certified Registered"

## 2023-05-24 DIAGNOSIS — R159 Full incontinence of feces: Secondary | ICD-10-CM

## 2023-05-24 DIAGNOSIS — Q438 Other specified congenital malformations of intestine: Secondary | ICD-10-CM | POA: Insufficient documentation

## 2023-05-24 DIAGNOSIS — K219 Gastro-esophageal reflux disease without esophagitis: Secondary | ICD-10-CM | POA: Diagnosis not present

## 2023-05-24 DIAGNOSIS — G809 Cerebral palsy, unspecified: Secondary | ICD-10-CM | POA: Insufficient documentation

## 2023-05-24 DIAGNOSIS — Z79899 Other long term (current) drug therapy: Secondary | ICD-10-CM

## 2023-05-24 DIAGNOSIS — K5909 Other constipation: Secondary | ICD-10-CM | POA: Diagnosis not present

## 2023-05-24 DIAGNOSIS — I10 Essential (primary) hypertension: Secondary | ICD-10-CM

## 2023-05-24 DIAGNOSIS — K625 Hemorrhage of anus and rectum: Secondary | ICD-10-CM | POA: Insufficient documentation

## 2023-05-24 DIAGNOSIS — F418 Other specified anxiety disorders: Secondary | ICD-10-CM | POA: Diagnosis not present

## 2023-05-24 DIAGNOSIS — F32A Depression, unspecified: Secondary | ICD-10-CM | POA: Insufficient documentation

## 2023-05-24 DIAGNOSIS — K921 Melena: Secondary | ICD-10-CM

## 2023-05-24 DIAGNOSIS — I679 Cerebrovascular disease, unspecified: Secondary | ICD-10-CM | POA: Diagnosis not present

## 2023-05-24 DIAGNOSIS — F419 Anxiety disorder, unspecified: Secondary | ICD-10-CM | POA: Diagnosis not present

## 2023-05-24 DIAGNOSIS — R152 Fecal urgency: Secondary | ICD-10-CM

## 2023-05-24 HISTORY — PX: COLONOSCOPY WITH PROPOFOL: SHX5780

## 2023-05-24 LAB — BASIC METABOLIC PANEL
Anion gap: 10 (ref 5–15)
BUN: 10 mg/dL (ref 6–20)
CO2: 26 mmol/L (ref 22–32)
Calcium: 8.9 mg/dL (ref 8.9–10.3)
Chloride: 99 mmol/L (ref 98–111)
Creatinine, Ser: 0.65 mg/dL (ref 0.61–1.24)
GFR, Estimated: >60 mL/min (ref >60)
Glucose, Bld: 99 mg/dL (ref 70–99)
Potassium: 3.9 mmol/L (ref 3.5–5.1)
Sodium: 135 mmol/L (ref 135–145)

## 2023-05-24 SURGERY — COLONOSCOPY WITH PROPOFOL
Anesthesia: General

## 2023-05-24 MED ORDER — PROPOFOL 10 MG/ML IV BOLUS
INTRAVENOUS | Status: DC | PRN
  Administered 2023-05-24: 40 mg via INTRAVENOUS
  Administered 2023-05-24: 100 mg via INTRAVENOUS

## 2023-05-24 MED ORDER — LIDOCAINE HCL (CARDIAC) PF 100 MG/5ML IV SOSY
PREFILLED_SYRINGE | INTRAVENOUS | Status: DC | PRN
  Administered 2023-05-24: 80 mg via INTRAVENOUS

## 2023-05-24 MED ORDER — PROPOFOL 500 MG/50ML IV EMUL
INTRAVENOUS | Status: DC | PRN
  Administered 2023-05-24: 125 ug/kg/min via INTRAVENOUS

## 2023-05-24 MED ORDER — LIDOCAINE HCL (PF) 2 % IJ SOLN
INTRAMUSCULAR | Status: AC
  Filled 2023-05-24: qty 10

## 2023-05-24 MED ORDER — LACTATED RINGERS IV SOLN: INTRAVENOUS | Status: DC | PRN

## 2023-05-24 MED ORDER — PROPOFOL 500 MG/50ML IV EMUL
INTRAVENOUS | Status: AC
  Filled 2023-05-24: qty 50

## 2023-05-24 MED ORDER — LACTATED RINGERS IV SOLN: INTRAVENOUS | Status: DC

## 2023-05-24 MED ORDER — PROPOFOL 1000 MG/100ML IV EMUL
INTRAVENOUS | Status: AC
  Filled 2023-05-24: qty 100

## 2023-05-24 MED ORDER — LIDOCAINE HCL (PF) 2 % IJ SOLN
INTRAMUSCULAR | Status: AC
  Filled 2023-05-24: qty 5

## 2023-05-24 NOTE — Transfer of Care (Signed)
Immediate Anesthesia Transfer of Care Note  Patient: MAHKAI PITONES  Procedure(s) Performed: COLONOSCOPY WITH PROPOFOL  Patient Location: Short Stay  Anesthesia Type:General  Level of Consciousness: drowsy and patient cooperative  Airway & Oxygen Therapy: Patient Spontanous Breathing and Patient connected to nasal cannula oxygen  Post-op Assessment: Report given to RN and Post -op Vital signs reviewed and stable  Post vital signs: Reviewed and stable  Last Vitals:  Vitals Value Taken Time  BP 104/73 05/24/23 1040  Temp 36.4 C 05/24/23 1040  Pulse 79 05/24/23 1042  Resp 17 05/24/23 1042  SpO2 97 % 05/24/23 1042  Vitals shown include unfiled device data.  Last Pain:  Vitals:   05/24/23 1040  TempSrc: Axillary  PainSc: 0-No pain         Complications: No notable events documented.

## 2023-05-24 NOTE — Op Note (Signed)
[facility] Patient Name: Tom Russell Procedure Date: 05/24/2023 9:50 AM MRN: [id number] Date of Birth: [birth year] Attending MD: Gennette Pac , MD, [id number] CSN: [id number] Age: 52 Admit Type: Outpatient Procedure:                Colonoscopy Indications:              Incontinence, rectal bleeding.                           Hematochezia Providers:                Gennette Pac, MD, Nena Polio, RN, Pandora Leiter, Technician Referring MD:              Medicines:                Propofol per Anesthesia Complications:            No immediate complications. Estimated Blood Loss:     Estimated blood loss: none. Estimated blood loss:                            none. Procedure:                Pre-Anesthesia Assessment:                           - Prior to the procedure, a History and Physical                            was performed, and patient medications and                            allergies were reviewed. The patient's tolerance of                            previous anesthesia was also reviewed. The risks                            and benefits of the procedure and the sedation                            options and risks were discussed with the patient.                            All questions were answered, and informed consent                            was obtained. Prior Anticoagulants: The patient has                            taken no anticoagulant or antiplatelet agents. ASA                            Grade Assessment: III - A patient with severe  systemic disease. After reviewing the risks and                            benefits, the patient was deemed in satisfactory                            condition to undergo the procedure.                           After obtaining informed consent, the colonoscope                            was passed under direct vision. Throughout the                             procedure, the patient's blood pressure, pulse, and                            oxygen saturations were monitored continuously. The                            [id number]) scope was introduced through the                            anus and advanced to the the cecum, identified by                            appendiceal orifice and ileocecal valve. After                            obtaining informed consent, the colonoscope was                            passed under direct vision. Throughout the                            procedure, the patient's blood pressure, pulse, and                            oxygen saturations were monitored continuously. The                            colonoscopy was performed without difficulty. The                            patient tolerated the procedure well. The quality                            of the bowel preparation was adequate. The                            ileocecal valve, appendiceal orifice, and rectum  were photographed. The entire colon was well                            visualized. Scope In: 10:11:47 AM Scope Out: 10:31:18 AM Scope Withdrawal Time: 0 hours 7 minutes 22 seconds  Total Procedure Duration: 0 hours 19 minutes 31 seconds  Findings:      The perianal and digital rectal examinations were normal. I easily       advance the scope into the left colon. Prep was really good. I was able       to continue advancing the scope all the way to the ascending segment.       There was redundancy. To reach the cecum I had the patient's position       turned and external abdominal pressure was applied. The cecum was deeply       intubated. From this level the scope was slowly withdrawn a previous       manage because surfaces were again seen the colonic mucosa appeared       normal.      I retroflexion of anal verge revealed no abnormalities.      The colon (entire examined portion) appeared normal. Impression:                Redundant and elongated.                           - The entire examined colon is normal.                           - No specimens collected. Moderate Sedation:      Moderate (conscious) sedation was personally administered by an       anesthesia professional. The following parameters were monitored: oxygen       saturation, heart rate, blood pressure, respiratory rate, EKG, adequacy       of pulmonary ventilation, and response to care. Recommendation:           - Patient has a contact number available for                            emergencies. The signs and symptoms of potential                            delayed complications were discussed with the                            patient. Return to normal activities tomorrow.                            Written discharge instructions were provided to the                            patient.                           - Advance diet as tolerated.                           - Repeat colonoscopy at age 27  for screening                            purposes. Return to the GI office in 3 months. Procedure Code(s):        --- Professional ---                           [phone], Colonoscopy, flexible; diagnostic, including                            collection of specimen(s) by brushing or washing,                            when performed (separate procedure) Diagnosis Code(s):        --- Professional ---                           K92.1, Melena (includes Hematochezia) CPT copyright 2022 [employer] Association. All rights reserved. The codes documented in this report are preliminary and upon coder review may  be revised to meet current compliance requirements. Gerrit Friends. Soo Steelman, MD Gennette Pac, MD 05/24/2023 10:45:21 AM This report has been signed electronically. Number of Addenda: 0

## 2023-05-24 NOTE — Anesthesia Preprocedure Evaluation (Addendum)
Anesthesia Evaluation  Patient identified by MRN, date of birth, ID band Patient awake    Reviewed: Allergy & Precautions, H&P , NPO status , Patient's Chart, lab work & pertinent test results  Airway Mallampati: II  TM Distance: >3 FB Neck ROM: Full    Dental  (+) Dental Advisory Given, Teeth Intact   Pulmonary neg pulmonary ROS   Pulmonary exam normal breath sounds clear to auscultation       Cardiovascular Exercise Tolerance: Poor Normal cardiovascular exam Rhythm:Regular Rate:Normal     Neuro/Psych Seizures -, Well Controlled,  PSYCHIATRIC DISORDERS Anxiety Depression    Cerebral palsy CVA (as per his legal gaurdian, patient never had stroke)    GI/Hepatic Neg liver ROS,GERD  Medicated,,  Endo/Other  negative endocrine ROS    Renal/GU negative Renal ROS  negative genitourinary   Musculoskeletal negative musculoskeletal ROS (+)    Abdominal   Peds negative pediatric ROS (+)  Hematology negative hematology ROS (+)   Anesthesia Other Findings   Reproductive/Obstetrics negative OB ROS                             Anesthesia Physical Anesthesia Plan  ASA: 3  Anesthesia Plan: General   Post-op Pain Management: Minimal or no pain anticipated   Induction: Intravenous  PONV Risk Score and Plan: 1 and Propofol infusion  Airway Management Planned: Nasal Cannula and Natural Airway  Additional Equipment:   Intra-op Plan:   Post-operative Plan:   Informed Consent: I have reviewed the patients History and Physical, chart, labs and discussed the procedure including the risks, benefits and alternatives for the proposed anesthesia with the patient or authorized representative who has indicated his/her understanding and acceptance.     Consent reviewed with POA  Plan Discussed with: CRNA and Surgeon  Anesthesia Plan Comments:         Anesthesia Quick Evaluation

## 2023-05-24 NOTE — Progress Notes (Signed)
EKG not taken per Dr. Pilar Plate, anesthesiologist  Two fleet enemas were administered in pre-op @ 0900 and 0915  Output was yellow, cloudy

## 2023-05-24 NOTE — H&P (Signed)
@LOGO @   Primary Care Physician:  Kirstie Peri, MD Primary Gastroenterologist:  Dr. Jena Gauss  Pre-Procedure History & Physical: HPI:  Tom Russell is a 52 y.o. male here for here for further evaluation of constipation rectal bleeding via sigmoidoscopy.  Prior to for colonoscopy complicated by intraoperative seizure.  Prior complete imaging of his colon.  No prior barium enema.  Past Medical History:  Diagnosis Date   Anxiety    Cerebral palsy (HCC)    Depression    GERD (gastroesophageal reflux disease)    Mental retardation    Seizures (HCC)     Past Surgical History:  Procedure Laterality Date   ESOPHAGOGASTRODUODENOSCOPY (EGD) WITH PROPOFOL N/A 11/23/2017   normal esophagus, single 4 mm semi-sessile polyp in gastric fundus, duodenal bulb normal, s/p biopsy. Fundic polyp, negative H.pylori.    None to date     as of 11/14/16    Prior to Admission medications   Medication Sig Start Date End Date Taking? Authorizing Provider  amoxicillin-clavulanate (AUGMENTIN) 875-125 MG tablet Take 1 tablet by mouth 2 (two) times daily. 03/23/23  Yes [provider]  ARIPiprazole (ABILIFY) 5 MG tablet Take 5 mg by mouth every morning.   Yes [provider]  carbamazepine (CARBATROL) 300 MG 12 hr capsule Take 300 mg by mouth 3 (three) times daily.   Yes [provider]  cephALEXin (KEFLEX) 500 MG capsule Take 500 mg by mouth 2 (two) times daily. 03/20/23  Yes [provider]  diazepam (VALIUM) 5 MG tablet Take 5 mg by mouth 2 (two) times daily. (0800 & 2000)   Yes [provider]  fluvoxaMINE (LUVOX) 100 MG tablet Take 100 mg by mouth 2 (two) times daily. (0800 & 2000)   Yes [provider]  gabapentin (NEURONTIN) 800 MG tablet Take 800 mg by mouth in the morning, at noon, in the evening, and at bedtime. (0800, 1200, 1600 & 2000)   Yes [provider]  hydrochlorothiazide (HYDRODIURIL) 12.5 MG tablet Take 12.5 mg by mouth daily.   Yes  [provider]  omeprazole (PRILOSEC) 20 MG capsule TAKE 1 CAPSULE BY MOUTH TWICE DAILY BEFORE A MEAL Patient taking differently: Take 20 mg by mouth in the morning and at bedtime. (0800 & 1700) 05/06/20  Yes Gelene Mink, NP  potassium chloride (KLOR-CON) 10 MEQ tablet Take 8 mEq by mouth daily.   Yes [provider]  tamsulosin (FLOMAX) 0.4 MG CAPS capsule Take 0.4 mg by mouth in the morning.   Yes [provider]  zolpidem (AMBIEN) 5 MG tablet Take 5 mg by mouth at bedtime.   Yes [provider]  clotrimazole (LOTRIMIN) 1 % cream Apply 1 application  topically daily at 6 (six) AM.    [provider]  cromolyn (OPTICROM) 4 % ophthalmic solution SMARTSIG:In Eye(s) 03/20/23   [provider]  GOODSENSE CLEARLAX 17 GM/SCOOP powder Take 17 g by mouth in the morning. 01/09/21   [provider]  Incontinence Supply Disposable (PREVAIL BOXERS FOR MEN S-M) MISC by Miscellaneous route.    [provider]  loperamide (IMODIUM) 2 MG capsule Take 2 mg by mouth as needed for diarrhea or loose stools. Bid prn    [provider]  Olopatadine HCl 0.7 % SOLN Place 1 drop into both eyes in the morning.    [provider]  RESTASIS 0.05 % ophthalmic emulsion  03/20/23   [provider]  triamcinolone cream (KENALOG) 0.1 % Apply 1 application topically  2 (two) times daily. Apply to hands (0800 & 2000)    [provider]  Wheat Dextrin (BENEFIBER) POWD Mix 1 tablespoon in 8 ounces of water and give daily. 04/11/23   Aida Raider, NP    Allergies as of 04/26/2023   (No Known Allergies)    Family History  Family history unknown: Yes    Social History   Socioeconomic History   Marital status: Single    Spouse name: Not on file   Number of children: Not on file   Years of education: Not on file   Highest education level: Not on file  Occupational History   Not on file  Tobacco Use   Smoking  status: Never   Smokeless tobacco: Never  Vaping Use   Vaping status: Never Used  Substance and Sexual Activity   Alcohol use: No   Drug use: No   Sexual activity: Never    Birth control/protection: None  Other Topics Concern   Not on file  Social History Narrative   Not on file   Social Determinants of Health   Financial Resource Strain: Not on file  Food Insecurity: Not on file  Transportation Needs: Not on file  Physical Activity: Not on file  Stress: Not on file  Social Connections: Not on file  Intimate Partner Violence: Not on file    Review of Systems: See HPI, otherwise negative ROS  Physical Exam: Pulse 91   Temp 98.6 F (37 C) (Oral)   Resp 14   Ht 5\' 3"  (1.6 m)   Wt 79.4 kg   SpO2 93%   BMI 31.00 kg/m  General:   Alert,  Well-developed, well-nourished, pleasant and cooperative in NAD Neck:  Supple; no masses or thyromegaly. No significant cervical adenopathy. Lungs:  Clear throughout to auscultation.   No wheezes, crackles, or rhonchi. No acute distress. Heart:  Regular rate and rhythm; no murmurs, clicks, rubs,  or gallops. Abdomen: Non-distended, normal bowel sounds.  Soft and nontender without appreciable mass or hepatosplenomegaly.   Impression/Plan: 52 year old gentleman with cerebral palsy chronic constipation rectal bleeding here for sigmoidoscopy.  Here for a sigmoidoscopy. The risks, benefits, limitations, alternatives and imponderables have been reviewed with the patient. Questions have been answered. All parties are agreeable.       Notice: This dictation was prepared with Dragon dictation along with smaller phrase technology. Any transcriptional errors that result from this process are unintentional and may not be corrected upon review.

## 2023-05-24 NOTE — Discharge Instructions (Signed)
  Colonoscopy Discharge Instructions  Read the instructions outlined below and refer to this sheet in the next few weeks. These discharge instructions provide you with general information on caring for yourself after you leave the hospital. Your doctor may also give you specific instructions. While your treatment has been planned according to the most current medical practices available, unavoidable complications occasionally occur. If you have any problems or questions after discharge, call Dr. Jena Gauss at [phone]. ACTIVITY You may resume your regular activity, but move at a slower pace for the next 24 hours.  Take frequent rest periods for the next 24 hours.  Walking will help get rid of the air and reduce the bloated feeling in your belly (abdomen).  No driving for 24 hours (because of the medicine (anesthesia) used during the test).   Do not sign any important legal documents or operate any machinery for 24 hours (because of the anesthesia used during the test).  NUTRITION Drink plenty of fluids.  You may resume your normal diet as instructed by your doctor.  Begin with a light meal and progress to your normal diet. Heavy or fried foods are harder to digest and may make you feel sick to your stomach (nauseated).  Avoid alcoholic beverages for 24 hours or as instructed.  MEDICATIONS You may resume your normal medications unless your doctor tells you otherwise.  WHAT YOU CAN EXPECT TODAY Some feelings of bloating in the abdomen.  Passage of more gas than usual.  Spotting of blood in your stool or on the toilet paper.  IF YOU HAD POLYPS REMOVED DURING THE COLONOSCOPY: No aspirin products for 7 days or as instructed.  No alcohol for 7 days or as instructed.  Eat a soft diet for the next 24 hours.  FINDING OUT THE RESULTS OF YOUR TEST Not all test results are available during your visit. If your test results are not back during the visit, make an appointment with your caregiver to find out the  results. Do not assume everything is normal if you have not heard from your caregiver or the medical facility. It is important for you to follow up on all of your test results.  SEEK IMMEDIATE MEDICAL ATTENTION IF: You have more than a spotting of blood in your stool.  Your belly is swollen (abdominal distention).  You are nauseated or vomiting.  You have a temperature over 101.  You have abdominal pain or discomfort that is severe or gets worse throughout the day.    I was able to examine your entire colon.  Although it was elongated and redundant it was normal with no evidence of colitis, polyp or tumor   office follow-up with Brooke Bonito in 8 weeks.  Recommend repeat colonoscopy for screening purposes in 10 years  At patient request, called Barbette Hair at [phone] -  reviewed findings and recommendations

## 2023-05-24 NOTE — Anesthesia Postprocedure Evaluation (Signed)
Anesthesia Post Note  Patient: Tom Russell  Procedure(s) Performed: COLONOSCOPY WITH PROPOFOL  Patient location during evaluation: Phase II Anesthesia Type: General Level of consciousness: awake and alert Pain management: pain level controlled Vital Signs Assessment: post-procedure vital signs reviewed and stable Respiratory status: spontaneous breathing, nonlabored ventilation and respiratory function stable Cardiovascular status: blood pressure returned to baseline and stable Postop Assessment: no apparent nausea or vomiting Anesthetic complications: no  No notable events documented.   Last Vitals:  Vitals:   05/24/23 0830 05/24/23 1040  BP:  104/73  Pulse: 91 79  Resp: 14 14  Temp: 37 C (!) 36.4 C  SpO2: 93% 97%    Last Pain:  Vitals:   05/24/23 1040  TempSrc: Axillary  PainSc: 0-No pain                 Luisantonio Adinolfi C Kule Gascoigne

## 2023-05-26 ENCOUNTER — Encounter (HOSPITAL_COMMUNITY): Payer: Self-pay | Admitting: Internal Medicine

## 2023-06-07 DIAGNOSIS — G809 Cerebral palsy, unspecified: Secondary | ICD-10-CM | POA: Diagnosis not present

## 2023-06-07 DIAGNOSIS — Z299 Encounter for prophylactic measures, unspecified: Secondary | ICD-10-CM | POA: Diagnosis not present

## 2023-06-07 DIAGNOSIS — Q845 Enlarged and hypertrophic nails: Secondary | ICD-10-CM | POA: Diagnosis not present

## 2023-06-07 DIAGNOSIS — M545 Low back pain, unspecified: Secondary | ICD-10-CM | POA: Diagnosis not present

## 2023-06-08 ENCOUNTER — Ambulatory Visit (INDEPENDENT_AMBULATORY_CARE_PROVIDER_SITE_OTHER): Payer: 59 | Admitting: Neurology

## 2023-06-08 ENCOUNTER — Encounter: Payer: Self-pay | Admitting: Neurology

## 2023-06-08 VITALS — BP 119/83 | HR 84 | Wt 165.8 lb

## 2023-06-08 DIAGNOSIS — G801 Spastic diplegic cerebral palsy: Secondary | ICD-10-CM | POA: Diagnosis not present

## 2023-06-08 DIAGNOSIS — G40209 Localization-related (focal) (partial) symptomatic epilepsy and epileptic syndromes with complex partial seizures, not intractable, without status epilepticus: Secondary | ICD-10-CM | POA: Diagnosis not present

## 2023-06-08 MED ORDER — CARBAMAZEPINE ER 300 MG PO CP12: 300.0000 mg | ORAL_CAPSULE | Freq: Three times a day (TID) | ORAL | 3 refills | Status: DC

## 2023-06-08 NOTE — Patient Instructions (Signed)
Good to meet you.  Continue all your medications  2. Schedule head CT without contrast  3. With the increased spasticity in his legs making him less able to do prior activities, we can ask our colleagues with Physical Medicine and Rehab if he is a candidate for Botox to help loosen the muscles and do physical therapy  4. Follow-up in 6 months, call for any changes

## 2023-06-08 NOTE — Progress Notes (Signed)
NEUROLOGY CONSULTATION NOTE  GOTTFRIED GIONFRIDDO MRN: [id number] DOB: 1971-05-16  Referring provider: Dr. Kirstie Peri Primary care provider: Dr. Kirstie Peri  Reason for consult:  establish care for seizures  Dear Dr Sherryll Burger:  Thank you for your kind referral of DESTYN BLAISDELL for consultation of the above symptoms. Although his history is well known to you, please allow me to reiterate it for the purpose of our medical record. The patient was accompanied to the clinic by group home manager Ayana and staff Kandee Keen who also provide collateral information. Records and images were personally reviewed where available.   HISTORY OF PRESENT ILLNESS: This is a pleasant 52 year old man with a history of developmental delay, cerebral palsy, and seizures, presenting to establish care after his neurologist retired. Prior records from Dr. Gerilyn Pilgrim unavailable for review. Sharlet Salina reports his sister is his guardian, he has been at the same group home for 25-30 years Sharlet Salina has known him for 4 years and had not witnessed any seizures until he was doing his colonoscopy prep in April 2023. He got very agitated, started screaming and cursing, then he stared off and had a convulsion with significant tongue bite. He was post-ictal for 25-30 minutes. No other staring spells. He denies any headaches, dizziness, sleep and appetite are good. No swallowing difficulties. He is on carbamazepine ER 300mg  TID. He is also on Gabapentin 800mg  four times a day and Diazepam 5mg  BID.  He has behavioral issues, destructive of his own things, hitting certain staff members where he gets loud and curses around once a week. He is non-ambulatory but would throw himself out of his chair, Ayana thinks he is confused why his legs go stiff. He complains about his legs a lot. Ayana reports a change in the past 4-5 months. In the past he would be able to stand longer, he would be able to stand for around 2 minutes during showers so they can wash him, but  now he can only stand for 5 seconds then sit down. He used to be able to wheel himself to the bathroom and hold on the bar, rotate himself to sit on the commode, now staff has to constantly remind him every 30 minutes about using the bathroom, he says he does not need to go but would soil his diapers. He cannot rotate himself onto the commode any more.   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    PAST MEDICAL HISTORY: Past Medical History:  Diagnosis Date   Anxiety    Cerebral palsy (HCC)    Depression    GERD (gastroesophageal reflux disease)    Mental retardation    Seizures (HCC)     PAST SURGICAL HISTORY: Past Surgical History:  Procedure Laterality Date   COLONOSCOPY WITH PROPOFOL N/A 05/24/2023   Procedure: COLONOSCOPY WITH PROPOFOL;  Surgeon: Corbin Ade, MD;  Location: [facility];  Service: Endoscopy;  Laterality: N/A;  930am, asa 3, group home patient   ESOPHAGOGASTRODUODENOSCOPY (EGD) WITH PROPOFOL N/A 11/23/2017   normal esophagus, single 4 mm semi-sessile polyp in gastric fundus, duodenal bulb  normal, s/p biopsy. Fundic polyp, negative H.pylori.    None to date     as of 11/14/16    MEDICATIONS: Current Outpatient Medications on File Prior to Visit  Medication Sig Dispense Refill   ARIPiprazole (ABILIFY) 5 MG tablet Take 5 mg by mouth every morning.     carbamazepine (CARBATROL) 300 MG 12 hr capsule Take 300 mg by mouth 3 (three) times daily.     clotrimazole (LOTRIMIN) 1 % cream Apply 1 application   topically daily at 6 (six) AM.     cromolyn (OPTICROM) 4 % ophthalmic solution SMARTSIG:In Eye(s)     diazepam (VALIUM) 5 MG tablet Take 5 mg by mouth 2 (two) times daily. (0800 & 2000)     fluvoxaMINE (LUVOX) 100 MG tablet Take 100 mg by mouth 2 (two) times daily. (0800 & 2000)     gabapentin (NEURONTIN) 800 MG tablet Take 800 mg by mouth in the morning, at noon, in the evening, and at bedtime. (0800, 1200, 1600 & 2000)     GOODSENSE CLEARLAX 17 GM/SCOOP powder Take 17 g by mouth in the morning.     hydrochlorothiazide (HYDRODIURIL) 12.5 MG tablet Take 12.5 mg by mouth daily.     Incontinence Supply Disposable (PREVAIL BOXERS FOR MEN S-M) MISC by Miscellaneous route.     nystatin-triamcinolone (MYCOLOG II) cream Apply topically.     omeprazole (PRILOSEC) 20 MG capsule TAKE 1 CAPSULE BY MOUTH TWICE DAILY BEFORE A MEAL (Patient taking differently: Take 20 mg by mouth in the morning and at bedtime. (0800 & 1700)) 60 capsule 11   RESTASIS 0.05 % ophthalmic emulsion      tamsulosin (FLOMAX) 0.4 MG CAPS capsule Take 0.4 mg by mouth in the morning.     triamcinolone cream (KENALOG) 0.1 % Apply 1 application topically 2 (two) times daily. Apply to hands (0800 & 2000)     Wheat Dextrin (BENEFIBER) POWD Mix 1 tablespoon in 8 ounces of water and give daily. 245 g 1   zolpidem (AMBIEN) 5 MG tablet Take 5 mg by mouth at bedtime.     No current facility-administered medications on file prior to visit.    ALLERGIES: No Known Allergies  FAMILY HISTORY: Family History  Family history unknown: Yes    SOCIAL HISTORY: Social History   Socioeconomic History   Marital status: Single    Spouse name: Not on file   Number of children: Not on file   Years of education: Not on file   Highest education level: Not on file  Occupational History   Not on file  Tobacco Use   Smoking status: Never   Smokeless tobacco: Never  Vaping Use   Vaping status: Never Used  Substance and Sexual Activity   Alcohol  use: No   Drug use: No   Sexual activity: Never    Birth control/protection: None  Other Topics Concern   Not on file  Social History Narrative   [facility]       Right hand       Social Determinants of Health   Financial Resource Strain: Not on file  Food Insecurity: Not on file  Transportation Needs: Not on file  Physical Activity: Not on file  Stress: Not on file  Social Connections: Not on file  Intimate Partner Violence: Not on file     PHYSICAL EXAM: Vitals:   06/08/23 0926 06/08/23 1011  BP: (!) 158/103 119/83  Pulse: 84   SpO2: 93%  General: No acute distress, sitting on wheelchair Head:  Normocephalic/atraumatic Skin/Extremities: No rash, no edema Neurological Exam: Mental status: alert and awake, able to say his birthday, able to follow simple commands.  Cranial nerves: CN I: not tested CN II: pupils equal, round, visual fields intact CN III, IV, VI:  +strabismus with restricted lateral gaze bilaterally and nystagmus on lateral gaze CN VII: upper and lower face symmetric CN VIII: hearing intact to conversation Bulk & Tone: increased tone in both LE L>R Motor: 5/5 on both UE, 3/5 bilateral hip flexion, 5/5 knee extension and flexion, spasticity at both ankles Deep Tendon Reflexes: +2 throughout Cerebellar: ataxia on finger to nose testing bilaterally Gait: not tested Tremor: none   IMPRESSION: This is a pleasant 52 year old man with a history of developmental delay, cerebral palsy, and seizures, presenting to establish care after his neurologist retired. Staff has known him for 4 years and has only witnessed one focal to bilateral tonic-clonic seizure in the setting of colonoscopy prep last April 2023. Continue Carbamazepine ER 300mg  TID. He is also on Gabapentin and Diazepam. Over the past 4-5 months, there has been a change in his mobility, he is non-ambulatory but was previously able to stand for longer periods of time and do his own  transfers and independently use the commode. Head CT without contrast will be ordered to assess for new changes. We discussed the spasticity and referral to Dr. Wynn Banker to evaluate if Botox would be helpful for him. They will speak with his guardian and let us know. Continue 24/7 care. Follow-up in 6 months, call for any changes.    Thank you for allowing me to participate in the care of this patient. Please do not hesitate to call for any questions or concerns.   Patrcia Dolly, M.D.  CC: Dr. Sherryll Burger

## 2023-06-16 ENCOUNTER — Ambulatory Visit
Admission: RE | Admit: 2023-06-16 | Discharge: 2023-06-16 | Disposition: A | Discharge status: Home / Self Care | Payer: 59 | Source: Ambulatory Visit | Attending: Neurology

## 2023-06-16 DIAGNOSIS — G40209 Localization-related (focal) (partial) symptomatic epilepsy and epileptic syndromes with complex partial seizures, not intractable, without status epilepticus: Secondary | ICD-10-CM

## 2023-06-16 DIAGNOSIS — G801 Spastic diplegic cerebral palsy: Secondary | ICD-10-CM

## 2023-06-20 ENCOUNTER — Ambulatory Visit: Payer: 59 | Admitting: Neurology

## 2023-06-29 DIAGNOSIS — L110 Acquired keratosis follicularis: Secondary | ICD-10-CM | POA: Diagnosis not present

## 2023-06-29 DIAGNOSIS — M79674 Pain in right toe(s): Secondary | ICD-10-CM | POA: Diagnosis not present

## 2023-06-29 DIAGNOSIS — M79675 Pain in left toe(s): Secondary | ICD-10-CM | POA: Diagnosis not present

## 2023-06-29 DIAGNOSIS — M79672 Pain in left foot: Secondary | ICD-10-CM | POA: Diagnosis not present

## 2023-06-29 DIAGNOSIS — M79671 Pain in right foot: Secondary | ICD-10-CM | POA: Diagnosis not present

## 2023-06-29 DIAGNOSIS — I739 Peripheral vascular disease, unspecified: Secondary | ICD-10-CM | POA: Diagnosis not present

## 2023-07-05 ENCOUNTER — Telehealth: Payer: Self-pay

## 2023-07-05 NOTE — Telephone Encounter (Signed)
Pt caregiver called an informed  the head CT did not show any new changes, it showed the old injuries from when he was younger. No tumor, bleed. Pls check with them if they have spoke to his guardian about seeing Dr. Wynn Banker for consideration for Botox to help loosen his tight limbs. Botox information mailed to address on file

## 2023-07-05 NOTE — Telephone Encounter (Signed)
-----   Message from Van Clines sent at 07/04/2023  9:57 AM EDT ----- Pls let caregiver know the head CT did not show any new changes, it showed the old injuries from when he was younger. No tumor, bleed. Pls check with them if they have spoke to his guardian about seeing Dr. Wynn Banker for consideration for Botox to help loosen his tight limbs. Thanks

## 2023-07-23 DIAGNOSIS — R079 Chest pain, unspecified: Secondary | ICD-10-CM | POA: Diagnosis not present

## 2023-07-23 DIAGNOSIS — R1013 Epigastric pain: Secondary | ICD-10-CM | POA: Diagnosis not present

## 2023-07-23 DIAGNOSIS — R109 Unspecified abdominal pain: Secondary | ICD-10-CM | POA: Diagnosis not present

## 2023-07-23 DIAGNOSIS — R072 Precordial pain: Secondary | ICD-10-CM | POA: Diagnosis not present

## 2023-07-23 DIAGNOSIS — Z743 Need for continuous supervision: Secondary | ICD-10-CM | POA: Diagnosis not present

## 2023-07-23 DIAGNOSIS — R6889 Other general symptoms and signs: Secondary | ICD-10-CM | POA: Diagnosis not present

## 2023-07-23 DIAGNOSIS — R9431 Abnormal electrocardiogram [ECG] [EKG]: Secondary | ICD-10-CM | POA: Diagnosis not present

## 2023-07-23 DIAGNOSIS — I499 Cardiac arrhythmia, unspecified: Secondary | ICD-10-CM | POA: Diagnosis not present

## 2023-07-25 ENCOUNTER — Telehealth: Payer: Self-pay | Admitting: Internal Medicine

## 2023-07-25 NOTE — Telephone Encounter (Signed)
Needs new patient approval

## 2023-07-25 NOTE — Telephone Encounter (Signed)
Called patient no answer.

## 2023-07-27 ENCOUNTER — Encounter: Payer: Self-pay | Admitting: Gastroenterology

## 2023-07-27 ENCOUNTER — Telehealth: Payer: Self-pay | Admitting: Neurology

## 2023-07-27 DIAGNOSIS — G801 Spastic diplegic cerebral palsy: Secondary | ICD-10-CM

## 2023-07-27 NOTE — Telephone Encounter (Signed)
Pls confirm with caregiver, we wanted to refer him for evaluation of Botox (not PT), just make sure that is what guardian agreed on. If yes, pls refer to Dr. Wynn Banker for consideration for Botox. Thanks

## 2023-07-27 NOTE — Telephone Encounter (Signed)
Pt caregiver called no answer left a voice mail to call the office back

## 2023-07-27 NOTE — Telephone Encounter (Signed)
Pt's caregiver called in stating they have gotten the OK from the pt's guardian for him to have PT. She would like to have a referral sent so he can be scheduled.

## 2023-08-01 ENCOUNTER — Telehealth: Payer: Self-pay | Admitting: Neurology

## 2023-08-01 NOTE — Telephone Encounter (Signed)
Pt caregiver called no answer left a voice mail to call the office back

## 2023-08-01 NOTE — Telephone Encounter (Signed)
Patient's caregiver missed the call, so she was returning the phone call back

## 2023-08-03 NOTE — Telephone Encounter (Signed)
Agreed to referral for Botox referral. Need to place referral. Thanks

## 2023-08-03 NOTE — Addendum Note (Signed)
Addended by: Dimas Chyle on: 08/03/2023 09:51 AM   Modules accepted: Orders

## 2023-08-03 NOTE — Telephone Encounter (Signed)
Order placed for Botox to Dr. Wynn Banker office

## 2023-08-04 DIAGNOSIS — Z299 Encounter for prophylactic measures, unspecified: Secondary | ICD-10-CM | POA: Diagnosis not present

## 2023-08-04 DIAGNOSIS — E876 Hypokalemia: Secondary | ICD-10-CM | POA: Diagnosis not present

## 2023-08-04 DIAGNOSIS — G809 Cerebral palsy, unspecified: Secondary | ICD-10-CM | POA: Diagnosis not present

## 2023-08-07 DIAGNOSIS — E876 Hypokalemia: Secondary | ICD-10-CM | POA: Diagnosis not present

## 2023-09-12 ENCOUNTER — Encounter: Payer: Self-pay | Admitting: Gastroenterology

## 2023-09-12 ENCOUNTER — Ambulatory Visit (INDEPENDENT_AMBULATORY_CARE_PROVIDER_SITE_OTHER): Payer: 59 | Admitting: Gastroenterology

## 2023-09-12 VITALS — BP 120/85 | HR 76 | Temp 97.7°F | Wt 165.0 lb

## 2023-09-12 DIAGNOSIS — K219 Gastro-esophageal reflux disease without esophagitis: Secondary | ICD-10-CM

## 2023-09-12 DIAGNOSIS — K5909 Other constipation: Secondary | ICD-10-CM

## 2023-09-12 DIAGNOSIS — R159 Full incontinence of feces: Secondary | ICD-10-CM | POA: Diagnosis not present

## 2023-09-12 DIAGNOSIS — R1030 Lower abdominal pain, unspecified: Secondary | ICD-10-CM | POA: Diagnosis not present

## 2023-09-12 DIAGNOSIS — K5900 Constipation, unspecified: Secondary | ICD-10-CM

## 2023-09-12 MED ORDER — BENEFIBER PO POWD: ORAL | 1 refills | Status: DC

## 2023-09-12 MED ORDER — SENNA 8.6 MG PO CAPS
1.0000 | ORAL_CAPSULE | Freq: Every evening | ORAL | 5 refills | Status: DC
Start: 2023-09-12 — End: 2023-12-13

## 2023-09-12 NOTE — Patient Instructions (Signed)
For GERD: Continue omeprazole 20 mg twice daily Continue famotidine 40 mg nightly Reflux diet  For constipation and lower abdominal pain: Continue MiraLAX 17 g once daily in the mornings Start fiber supplementation with 1 tablespoon once daily in the mornings mixed with MiraLAX. Start Senokot nightly.  Follow up 2 months  It was a pleasure to see you today. I want to create trusting relationships with patients. If you receive a survey regarding your visit,  I greatly appreciate you taking time to fill this out on paper or through your MyChart. I value your feedback.  Brooke Bonito, MSN, FNP-BC, AGACNP-BC [facility]

## 2023-09-12 NOTE — Progress Notes (Signed)
GI Office Note    Referring Provider: Kirstie Peri, MD Primary Care Physician:  Kirstie Peri, MD Primary Gastroenterologist: Gerrit Friends.Rourk, MD  Date:  09/12/2023  ID:  Tom Russell, DOB Dec 11, 1970, MRN [id number]   Chief Complaint   Chief Complaint  Patient presents with   Follow-up    Follow up. Still having problems with stomach    History of Present Illness  Tom Russell is a 52 y.o. male with a history of GERD, CP, anxiety, depression, and seizures presenting today with complaint of stomach pain.  Last office visit 04/11/2023.  Caregiver reported having too many loose stools with MiraLAX and was having more accidents.  Patient unable to tell when he has done a bowel movement.  Having some lower abdominal pain.  Has a good appetite.  MiraLAX was placed as needed given to me looser stools.  Patient reportedly talked to his sister about a colonoscopy but she did not say anything about it.  Denied any nausea or vomiting.  Doing okay in regards to swallowing.  Reflux controlled.  Advise continue omeprazole 20 mg twice daily, GERD diet.  Keep MiraLAX as needed and start Metamucil or Benefiber daily.  Flexible sigmoidoscopy scheduled..  Colonoscopy 05/24/2023: -Redundant and elongated colon -Entire examined colon normal   Today: Accompanied by caregiver who helps give some history.  Sometime he can go to the bathroom and sometimes he can't. He has to sit there and strain at times. Has pain in the lower abdomen mostly. Pain gets better after bowel movments. He is not having as many accidents as previous. No rectal bleeding or melena. Has a good appetite and eating well. No epigstruic pain or burning. No nausea or vomiting.  No dysphagia.   Wt Readings from Last 3 Encounters:  09/12/23 165 lb (74.8 kg)  06/08/23 165 lb 12.8 oz (75.2 kg)  05/24/23 175 lb (79.4 kg)    Current Outpatient Medications  Medication Sig Dispense Refill   ARIPiprazole (ABILIFY) 5 MG tablet Take 5 mg by  mouth every morning.     carbamazepine (CARBATROL) 300 MG 12 hr capsule Take 1 capsule (300 mg total) by mouth 3 (three) times daily. 270 capsule 3   clotrimazole (LOTRIMIN) 1 % cream Apply 1 application  topically daily at 6 (six) AM.     cromolyn (OPTICROM) 4 % ophthalmic solution SMARTSIG:In Eye(s)     diazepam (VALIUM) 5 MG tablet Take 5 mg by mouth 2 (two) times daily. (0800 & 2000)     fluvoxaMINE (LUVOX) 100 MG tablet Take 100 mg by mouth 2 (two) times daily. (0800 & 2000)     gabapentin (NEURONTIN) 800 MG tablet Take 800 mg by mouth in the morning, at noon, in the evening, and at bedtime. (0800, 1200, 1600 & 2000)     GOODSENSE CLEARLAX 17 GM/SCOOP powder Take 17 g by mouth in the morning.     hydrochlorothiazide (HYDRODIURIL) 12.5 MG tablet Take 12.5 mg by mouth daily.     Incontinence Supply Disposable (PREVAIL BOXERS FOR MEN S-M) MISC by Miscellaneous route.     nystatin-triamcinolone (MYCOLOG II) cream Apply topically.     omeprazole (PRILOSEC) 20 MG capsule TAKE 1 CAPSULE BY MOUTH TWICE DAILY BEFORE A MEAL (Patient taking differently: Take 20 mg by mouth in the morning and at bedtime. (0800 & 1700)) 60 capsule 11   RESTASIS 0.05 % ophthalmic emulsion      tamsulosin (FLOMAX) 0.4 MG CAPS capsule Take 0.4 mg by mouth in  the morning.     triamcinolone cream (KENALOG) 0.1 % Apply 1 application topically 2 (two) times daily. Apply to hands (0800 & 2000)     Wheat Dextrin (BENEFIBER) POWD Mix 1 tablespoon in 8 ounces of water and give daily. 245 g 1   zolpidem (AMBIEN) 5 MG tablet Take 5 mg by mouth at bedtime.     No current facility-administered medications for this visit.    Past Medical History:  Diagnosis Date   Anxiety    Cerebral palsy (HCC)    Depression    GERD (gastroesophageal reflux disease)    Mental retardation    Seizures (HCC)     Past Surgical History:  Procedure Laterality Date   COLONOSCOPY WITH PROPOFOL N/A 05/24/2023   Procedure: COLONOSCOPY WITH PROPOFOL;   Surgeon: Corbin Ade, MD;  Location: [facility];  Service: Endoscopy;  Laterality: N/A;  930am, asa 3, group home patient   ESOPHAGOGASTRODUODENOSCOPY (EGD) WITH PROPOFOL N/A 11/23/2017   normal esophagus, single 4 mm semi-sessile polyp in gastric fundus, duodenal bulb normal, s/p biopsy. Fundic polyp, negative H.pylori.    None to date     as of 11/14/16    Family History  Family history unknown: Yes    Allergies as of 09/12/2023   (No Known Allergies)    Social History   Socioeconomic History   Marital status: Single    Spouse name: Not on file   Number of children: Not on file   Years of education: Not on file   Highest education level: Not on file  Occupational History   Not on file  Tobacco Use   Smoking status: Never   Smokeless tobacco: Never  Vaping Use   Vaping status: Never Used  Substance and Sexual Activity   Alcohol use: No   Drug use: No   Sexual activity: Never    Birth control/protection: None  Other Topics Concern   Not on file  Social History Narrative   [facility]       Right hand       Social Determinants of Health   Financial Resource Strain: Not on file  Food Insecurity: Not on file  Transportation Needs: Not on file  Physical Activity: Not on file  Stress: Not on file  Social Connections: Not on file     Review of Systems   Gen: Denies fever, chills, anorexia. Denies fatigue, weakness, weight loss.  CV: Denies chest pain, palpitations, syncope, peripheral edema, and claudication. Resp: Denies dyspnea at rest, cough, wheezing, coughing up blood, and pleurisy. GI: See HPI Derm: Denies rash, itching, dry skin Psych: Denies depression, anxiety, memory loss, confusion. No homicidal or suicidal ideation.  Heme: Denies bruising, bleeding, and enlarged lymph nodes.  Physical Exam   BP 120/85 (BP Location: Right Arm, Patient Position: Sitting, Cuff Size: Normal)   Pulse 76   Temp 97.7 F (36.5 C) (Temporal)   Wt  165 lb (74.8 kg)   BMI 29.23 kg/m   General:   Alert and oriented. No distress noted. Pleasant and cooperative.  Head:  Normocephalic and atraumatic. Eyes:  Conjuctiva clear without scleral icterus.  Incongruent Mouth:  Oral mucosa pink and moist. Good dentition. No lesions. Lungs:  Clear to auscultation bilaterally. No wheezes, rales, or rhonchi. No distress.  Heart:  S1, S2 present without murmurs appreciated.  Abdomen:  +BS, soft, non-tender and non-distended. No rebound or guarding. No HSM or masses noted. Rectal: deferred Msk:  UTA, currently  in wheelchair Neurologic:  Alert and oriented. Psych:  Alert and cooperative. Normal mood and affect.  Assessment  Tom Russell is a 52 y.o. male with a history of GERD, CP, anxiety, depression, and seizures presenting today with complaint of stomach pain.  GERD: Denies any nausea, vomiting, or dysphagia.  Also denies any upper abdominal pain.  GERD for epigastric pain and famotidine added to regimen.  Currently appears to be doing well on low-dose PPI twice daily and famotidine.  Will continue current regimen for now.  Constipation, rectal bleeding, fecal incontinence: Has intermittent fecal incontinence and baseline chronic constipation.  Constipation likely secondary to elongated and redundant colon noted on most recent colonoscopy in August.  Has had frequent episodes of diarrhea in the past with MiraLAX twice daily therefore he is currently only taking this once daily.  Is still struggling with some straining and some lower abdominal pain that does improve after bowel movements.  Per his med list it does not appear that he has started any fiber supplementation therefore I will rewrite for this again today and advised to begin this in the mornings along with MiraLAX.  For now we will also do low-dose Senokot nightly.  If he begins to have too many episodes of diarrhea we will stop the Senokot and just continue MiraLAX and fiber.  I suspect his  abdominal pain is secondary to his bowel function.  PLAN   Continue omeprazole 20 mg twice daily. Continue famotidine 40 mg nightly GERD diet Continue MiraLAX 17 g daily Start Senokot nightly Start fiber supplementation with 1 tablespoon once daily in the mornings.  Follow up in 2 months.     Brooke Bonito, MSN, FNP-BC, AGACNP-BC [facility]

## 2023-09-26 DIAGNOSIS — M79674 Pain in right toe(s): Secondary | ICD-10-CM | POA: Diagnosis not present

## 2023-09-26 DIAGNOSIS — M79675 Pain in left toe(s): Secondary | ICD-10-CM | POA: Diagnosis not present

## 2023-09-26 DIAGNOSIS — I739 Peripheral vascular disease, unspecified: Secondary | ICD-10-CM | POA: Diagnosis not present

## 2023-09-26 DIAGNOSIS — L110 Acquired keratosis follicularis: Secondary | ICD-10-CM | POA: Diagnosis not present

## 2023-09-26 DIAGNOSIS — M79671 Pain in right foot: Secondary | ICD-10-CM | POA: Diagnosis not present

## 2023-09-26 DIAGNOSIS — M79672 Pain in left foot: Secondary | ICD-10-CM | POA: Diagnosis not present

## 2023-09-29 ENCOUNTER — Encounter: Payer: Self-pay | Admitting: Gastroenterology

## 2023-11-07 ENCOUNTER — Encounter: Payer: Self-pay | Admitting: Physical Medicine & Rehabilitation

## 2023-11-09 ENCOUNTER — Encounter: Payer: Self-pay | Admitting: Gastroenterology

## 2023-11-24 ENCOUNTER — Encounter: Payer: 59 | Attending: Physical Medicine & Rehabilitation | Admitting: Physical Medicine & Rehabilitation

## 2023-11-24 ENCOUNTER — Encounter: Payer: Self-pay | Admitting: Physical Medicine & Rehabilitation

## 2023-11-24 VITALS — BP 123/82 | HR 76

## 2023-11-24 DIAGNOSIS — G800 Spastic quadriplegic cerebral palsy: Secondary | ICD-10-CM | POA: Insufficient documentation

## 2023-11-24 NOTE — Patient Instructions (Signed)
.   You may experience soreness at the needle injection sites. Please call us if any of the injection sites turns red after a couple days or if there is any drainage. You may experience muscle weakness as a result of Botox. This would improve with time but can take several weeks to improve. The Botox should start working in about one week. The Botox usually last 3 months. The injection can be repeated every 3 months as needed.

## 2023-11-24 NOTE — Progress Notes (Signed)
Subjective:    Patient ID: Tom Russell, male    DOB: 1971/07/05, 53 y.o.   MRN: [id number]  HPI 53 yo male with CP referred by neurology to evaluate for botulinum toxin injections to help with lower extremity spasticity.  The patient has been in a group home for helping care for about 20 years.  He does not ever remember him walking.  The patient reportedly walked when he was in his teenage years.  The patient is unable to provide his own history due to cognitive deficits. More recently the patient was able to stand to shower up until about a year ago now his knees tend to buckle. He is dependent for all self-care  Pain Inventory Average Pain 5 Pain Right Now 0 My pain is tingling  LOCATION OF PAIN  leg  BOWEL Number of stools per week: 7 Oral laxative use Yes  Type of laxative senna/miralax Enema or suppository use No  History of colostomy No  Incontinent No   BLADDER Normal In and out cath, frequency . Able to self cath  . Bladder incontinence No  Frequent urination No  Leakage with coughing No  Difficulty starting stream No  Incomplete bladder emptying No    Mobility how many minutes can you walk? 0 ability to climb steps?  no do you drive?  no use a wheelchair needs help with transfers  Function disabled: date disabled . I need assistance with the following:  dressing, bathing, toileting, meal prep, household duties, and shopping  Neuro/Psych weakness numbness tingling  Prior Studies New pt  Physicians involved in your care New pt   Family History  Family history unknown: Yes   Social History   Socioeconomic History   Marital status: Single    Spouse name: Not on file   Number of children: Not on file   Years of education: Not on file   Highest education level: Not on file  Occupational History   Not on file  Tobacco Use   Smoking status: Never   Smokeless tobacco: Never  Vaping Use   Vaping status: Never Used  Substance and Sexual  Activity   Alcohol use: No   Drug use: No   Sexual activity: Never    Birth control/protection: None  Other Topics Concern   Not on file  Social History Narrative   [facility]       Right hand       Social Drivers of Health   Financial Resource Strain: Not on file  Food Insecurity: Not on file  Transportation Needs: Not on file  Physical Activity: Not on file  Stress: Not on file  Social Connections: Not on file   Past Surgical History:  Procedure Laterality Date   COLONOSCOPY WITH PROPOFOL N/A 05/24/2023   Procedure: COLONOSCOPY WITH PROPOFOL;  Surgeon: Corbin Ade, MD;  Location: [facility];  Service: Endoscopy;  Laterality: N/A;  930am, asa 3, group home patient   ESOPHAGOGASTRODUODENOSCOPY (EGD) WITH PROPOFOL N/A 11/23/2017   normal esophagus, single 4 mm semi-sessile polyp in gastric fundus, duodenal bulb normal, s/p biopsy. Fundic polyp, negative H.pylori.    None to date     as of 11/14/16   Past Medical History:  Diagnosis Date   Anxiety    Cerebral palsy (HCC)    Depression    GERD (gastroesophageal reflux disease)    Mental retardation    Seizures (HCC)    BP 123/82   Pulse 76  SpO2 94%   Opioid Risk Score:   Fall Risk Score:  `1  Depression screen PHQ 2/9      No data to display           Review of Systems  Gastrointestinal:  Positive for constipation.  Musculoskeletal:        Leg pain  Neurological:  Positive for weakness and numbness.  All other systems reviewed and are negative.     Objective:   Physical Exam HENT:     Head: Normocephalic and atraumatic.  Eyes:     Extraocular Movements: Extraocular movements intact.     Conjunctiva/sclera: Conjunctivae normal.     Pupils: Pupils are equal, round, and reactive to light.  Musculoskeletal:     Right lower leg: No edema.     Left lower leg: No edema.  Skin:    General: Skin is warm and dry.  Neurological:     Mental Status: He is alert and oriented to person,  place, and time.     Gait: Gait abnormal.  Psychiatric:        Attention and Perception: Attention normal.        Mood and Affect: Mood normal. Affect is blunt and flat.        Speech: Speech is delayed and slurred.        Behavior: Behavior is slowed.   The patient can follow simple commands such as manual muscle testing Motor strength right upper extremity 4/5 deltoid, bicep, tricep, grip, left upper extremity 4/5 in the deltoid, bicep, tricep, grip Lower extremity strength 0/5 hip flexor knee extensor to minus ankle dorsiflexor plantar flexor Tone is normal in the upper extremities There is mild dysmetria with finger-nose-finger testing bilateral upper extremities Lower extremity tone is increased in the knee flexors bilaterally MAS 4 Ankles without evidence of clonus There is no evidence of hypertonicity in the hip adductor's. Sensation is reported is equal bilateral upper and lower limbs  Range of motion Ext right knee - 55 deg Ext left knee -30 deg      Assessment & Plan:  1.  Spastic quadriparesis secondary cerebral palsy Primary issue with standing is lack of knee extension.  As discussed with caregiver there certainly is spasticity but there also may be underlying soft tissue contracture.  We discussed that if the majority of the knees flexion is due to soft tissue contracture the Botox would not be of much benefit.  On the other hand if spasticity is the main cause for the knee flexion, Botox should be of good benefit to help with knee extension.  He may need additional physical therapy postinjection to help with quad strengthening

## 2023-11-30 ENCOUNTER — Ambulatory Visit: Payer: 59 | Admitting: Gastroenterology

## 2023-12-13 ENCOUNTER — Ambulatory Visit (INDEPENDENT_AMBULATORY_CARE_PROVIDER_SITE_OTHER): Payer: 59 | Admitting: Gastroenterology

## 2023-12-13 ENCOUNTER — Encounter: Payer: Self-pay | Admitting: Gastroenterology

## 2023-12-13 VITALS — BP 118/81 | HR 81 | Temp 98.2°F | Ht 63.0 in | Wt 149.0 lb

## 2023-12-13 DIAGNOSIS — K219 Gastro-esophageal reflux disease without esophagitis: Secondary | ICD-10-CM | POA: Diagnosis not present

## 2023-12-13 DIAGNOSIS — K5900 Constipation, unspecified: Secondary | ICD-10-CM | POA: Diagnosis not present

## 2023-12-13 DIAGNOSIS — R159 Full incontinence of feces: Secondary | ICD-10-CM | POA: Diagnosis not present

## 2023-12-13 MED ORDER — BENEFIBER PO POWD
ORAL | 1 refills | Status: AC
Start: 2023-12-13 — End: ?

## 2023-12-13 NOTE — Progress Notes (Signed)
 GI Office Note    Referring Provider: Kirstie Peri, MD Primary Care Physician:  Kirstie Peri, MD Primary Gastroenterologist: Gerrit Friends.Rourk, MD  Date:  12/13/2023  ID:  Tom Russell, DOB Feb 23, 1971, MRN [id number]   Chief Complaint   Chief Complaint  Patient presents with   Follow-up    Patient here for a follow up on constipation and Gerd. Patient is not having any current issues, per the aide the patient has times that he alternates with constipation and looser stools. Genella Rife is doing well on Omeprazole 20 mg bid, and after changing his diet. He is still taking Miralax daily, senna at bedtime.He is also taking fiber once per day.    History of Present Illness  Tom Russell is a 53 y.o. male with a history of CP, GERD, anxiety, depression, and seizures presenting today for follow-up of constipation and GERD.   office visit 04/11/2023.  Caregiver reported having too many loose stools with MiraLAX and was having more accidents.  Patient unable to tell when he has done a bowel movement.  Having some lower abdominal pain.  Has a good appetite.  MiraLAX was placed as needed given to me looser stools.  Patient reportedly talked to his sister about a colonoscopy but she did not say anything about it.  Denied any nausea or vomiting.  Doing okay in regards to swallowing.  Reflux controlled.  Advise continue omeprazole 20 mg twice daily, GERD diet.  Keep MiraLAX as needed and start Metamucil or Benefiber daily.  Flexible sigmoidoscopy scheduled..   Colonoscopy 05/24/2023: -Redundant and elongated colon -Entire examined colon norma  Last office visit 09/12/23.  Sometimes able to go to the restroom, sometimes cannot.  Does have to strain at times.  Having pain in the lower abdomen mostly with bowel movements.  Not having as many accidents as previously.  Has a good appetite and eating well.  Denied any epigastric pain or burning, nausea, vomiting, or dysphagia. Advised to continue omeprazole 20 mg twice  daily, famotidine 40 mg nightly, GERD diet.  Continue MiraLAX daily as well as starting Senokot nightly and started daily fiber supplementation once in the mornings.   Today:  Caregiver states he is having some loss of sensation and going to PT and they are considering Botox injections. She states they have him on a schedule to go every 30 minutes.   They have been doing well with diet changes and has been doing well on his medication twice daily for his reflux.  No rectal bleeding or melena.  Denies any chest pain, nausea, vomiting, dysphagia, abdominal pain, lack of appetite, early satiety.  Wt Readings from Last 3 Encounters:  12/13/23 149 lb (67.6 kg)  09/12/23 165 lb (74.8 kg)  06/08/23 165 lb 12.8 oz (75.2 kg)    Current Outpatient Medications  Medication Sig Dispense Refill   ARIPiprazole (ABILIFY) 5 MG tablet Take 5 mg by mouth every morning.     carbamazepine (CARBATROL) 300 MG 12 hr capsule Take 1 capsule (300 mg total) by mouth 3 (three) times daily. 270 capsule 3   clotrimazole (LOTRIMIN) 1 % cream Apply 1 application  topically daily at 6 (six) AM.     cromolyn (OPTICROM) 4 % ophthalmic solution SMARTSIG:In Eye(s)     diazepam (VALIUM) 5 MG tablet Take 5 mg by mouth 2 (two) times daily. (0800 & 2000)     fluvoxaMINE (LUVOX) 100 MG tablet Take 100 mg by mouth 2 (two) times daily. (0800 & 2000)  gabapentin (NEURONTIN) 800 MG tablet Take 800 mg by mouth in the morning, at noon, in the evening, and at bedtime. (0800, 1200, 1600 & 2000)     GOODSENSE CLEARLAX 17 GM/SCOOP powder Take 17 g by mouth in the morning.     hydrochlorothiazide (HYDRODIURIL) 12.5 MG tablet Take 12.5 mg by mouth daily. (Patient taking differently: Take 25 mg by mouth daily.)     Incontinence Supply Disposable (PREVAIL BOXERS FOR MEN S-M) MISC by Miscellaneous route.     omeprazole (PRILOSEC) 20 MG capsule TAKE 1 CAPSULE BY MOUTH TWICE DAILY BEFORE A MEAL (Patient taking differently: Take 20 mg by mouth  in the morning and at bedtime. (0800 & 1700)) 60 capsule 11   PATADAY 0.7 % SOLN      potassium chloride (KLOR-CON) 10 MEQ tablet Take 8 mEq by mouth daily.     Sennosides (SENNA) 8.6 MG CAPS Take 1 capsule by mouth at bedtime. 30 capsule 5   tamsulosin (FLOMAX) 0.4 MG CAPS capsule Take 0.4 mg by mouth in the morning.     TERBINAFINE EX Apply topically. BID fungus on toenails     triamcinolone cream (KENALOG) 0.1 % Apply 1 application topically 2 (two) times daily. Apply to hands (0800 & 2000)     Wheat Dextrin (BENEFIBER) POWD Mix 1 tablespoon in 8 ounces of water and give daily. 245 g 1   zolpidem (AMBIEN) 5 MG tablet Take 5 mg by mouth at bedtime.     No current facility-administered medications for this visit.    Past Medical History:  Diagnosis Date   Anxiety    Cerebral palsy (HCC)    Depression    GERD (gastroesophageal reflux disease)    Mental retardation    Seizures (HCC)     Past Surgical History:  Procedure Laterality Date   COLONOSCOPY WITH PROPOFOL N/A 05/24/2023   Procedure: COLONOSCOPY WITH PROPOFOL;  Surgeon: Corbin Ade, MD;  Location: [facility];  Service: Endoscopy;  Laterality: N/A;  930am, asa 3, group home patient   ESOPHAGOGASTRODUODENOSCOPY (EGD) WITH PROPOFOL N/A 11/23/2017   normal esophagus, single 4 mm semi-sessile polyp in gastric fundus, duodenal bulb normal, s/p biopsy. Fundic polyp, negative H.pylori.    None to date     as of 11/14/16    Family History  Family history unknown: Yes    Allergies as of 12/13/2023   (No Known Allergies)    Social History   Socioeconomic History   Marital status: Single    Spouse name: Not on file   Number of children: Not on file   Years of education: Not on file   Highest education level: Not on file  Occupational History   Not on file  Tobacco Use   Smoking status: Never   Smokeless tobacco: Never  Vaping Use   Vaping status: Never Used  Substance and Sexual Activity   Alcohol use: No   Drug  use: No   Sexual activity: Never    Birth control/protection: None  Other Topics Concern   Not on file  Social History Narrative   [facility]       Right hand       Social Drivers of Health   Financial Resource Strain: Not on file  Food Insecurity: Not on file  Transportation Needs: Not on file  Physical Activity: Not on file  Stress: Not on file  Social Connections: Not on file     Review of Systems  Gen: Denies fever, chills, anorexia. Denies fatigue, weakness, weight loss.  CV: Denies chest pain, palpitations, syncope, peripheral edema, and claudication. Resp: Denies dyspnea at rest, cough, wheezing, coughing up blood, and pleurisy. GI: See HPI Derm: Denies rash, itching, dry skin Psych: Denies depression, anxiety, memory loss, confusion. No homicidal or suicidal ideation.  Heme: Denies bruising, bleeding, and enlarged lymph nodes.  Physical Exam   BP 118/81 (BP Location: Right Arm, Patient Position: Sitting, Cuff Size: Normal)   Pulse 81   Temp 98.2 F (36.8 C) (Temporal)   Ht 5\' 3"  (1.6 m)   Wt 149 lb (67.6 kg)   BMI 26.39 kg/m   General:   Alert and oriented. No distress noted. Pleasant and cooperative.  Head:  Normocephalic and atraumatic. Eyes:  Conjuctiva clear without scleral icterus.  Incongruent. Mouth:  Oral mucosa pink and moist. Good dentition. No lesions. Abdomen:  +BS, soft, non-tender and non-distended. No rebound or guarding. No HSM or masses noted. Rectal: deferred Msk:  UTA, in wheelchair, able to move BUE Extremities:  Without edema. Neurologic:  Alert and  oriented x4.  Psych:  Alert and cooperative. Normal mood and affect.  Assessment  Tom Russell is a 53 y.o. male with a history of CP, GERD, anxiety, depression, and seizures presenting today for follow-up of constipation and GERD.   Constipation, fecal incontinence: Continues to have some intermittent fecal incontinence.  They have been having him on a toileting  schedule every 30 minutes during the daytime which has decreased his accidents.  Constipation likely secondary to elongated and redundant colon as well as his cerebral palsy.  Does have some intermittent loose stools as well, this was happening even prior to his last visit in December as well although at that time also had stated difficulty with straining and some lower abdominal pain  He was started on Senokot, given some more frequent looser stools currently, will discontinue Senokot and increase Metamucil to twice daily while continuing MiraLAX once daily.  Currently without any abdominal pain, nausea, or vomiting.  Has been following with physical therapy and there has been some talks of possible Botox to help with mobility as well as sensation in the perineum, encouraged to continue these talks to see if this improves his fecal incontinence.  GERD: Well-controlled with omeprazole 20 mg twice daily and diet changes.  PLAN   Continue with PT and considering botox to help with incontinence.  Increase Metamucil to 1 tablespoon twice daily Miralax 17g once daily Stop senna Continue toileting schedule Continue omeprazole 20 mg twice daily. GERD diet Follow up in 4 months.     Brooke Bonito, MSN, FNP-BC, AGACNP-BC [facility]

## 2023-12-13 NOTE — Patient Instructions (Addendum)
 Continue with physical therapy and considering Botox for incontinence.  Increase Metamucil to 1 tablespoon twice daily  Continue MiraLAX once daily  Stop nightly Senokot  Continue toileting schedule.  Follow-up in 4 months.  It was a pleasure to see you today. I want to create trusting relationships with patients. If you receive a survey regarding your visit,  I greatly appreciate you taking time to fill this out on paper or through your MyChart. I value your feedback.  Brooke Bonito, MSN, FNP-BC, AGACNP-BC [facility]

## 2023-12-26 DIAGNOSIS — L110 Acquired keratosis follicularis: Secondary | ICD-10-CM | POA: Diagnosis not present

## 2023-12-26 DIAGNOSIS — M79671 Pain in right foot: Secondary | ICD-10-CM | POA: Diagnosis not present

## 2023-12-26 DIAGNOSIS — M79674 Pain in right toe(s): Secondary | ICD-10-CM | POA: Diagnosis not present

## 2023-12-26 DIAGNOSIS — M79672 Pain in left foot: Secondary | ICD-10-CM | POA: Diagnosis not present

## 2023-12-26 DIAGNOSIS — I739 Peripheral vascular disease, unspecified: Secondary | ICD-10-CM | POA: Diagnosis not present

## 2023-12-26 DIAGNOSIS — M79675 Pain in left toe(s): Secondary | ICD-10-CM | POA: Diagnosis not present

## 2023-12-29 ENCOUNTER — Encounter: Payer: Self-pay | Admitting: Physical Medicine & Rehabilitation

## 2023-12-29 ENCOUNTER — Encounter: Payer: 59 | Attending: Physical Medicine & Rehabilitation | Admitting: Physical Medicine & Rehabilitation

## 2023-12-29 VITALS — BP 130/87 | HR 80 | Ht 63.0 in | Wt 149.0 lb

## 2023-12-29 DIAGNOSIS — G800 Spastic quadriplegic cerebral palsy: Secondary | ICD-10-CM | POA: Diagnosis not present

## 2023-12-29 MED ORDER — ONABOTULINUMTOXINA 100 UNITS IJ SOLR
400.0000 [IU] | Freq: Once | INTRAMUSCULAR | Status: AC
Start: 2023-12-29 — End: 2023-12-29
  Administered 2023-12-29: 400 [IU] via INTRAMUSCULAR

## 2023-12-29 MED ORDER — SODIUM CHLORIDE (PF) 0.9 % IJ SOLN
8.0000 mL | Freq: Once | INTRAMUSCULAR | Status: AC
Start: 2023-12-29 — End: 2023-12-29
  Administered 2023-12-29: 8 mL

## 2023-12-29 NOTE — Progress Notes (Signed)
 Botox Injection for spasticity using needle EMG guidance  Dilution: 50 Units/ml Indication: Severe spasticity which interferes with ADL,mobility and/or  hygiene and is unresponsive to medication management and other conservative care Informed consent was obtained after describing risks and benefits of the procedure with the patient. This includes bleeding, bruising, infection, excessive weakness, or medication side effects. A REMS form is on file and signed. Needle: 50mm 25g needle electrode Number of units per muscle Right  Hamstrings200 Left  Hamstrings 200 All injections were done after obtaining appropriate EMG activity and after negative drawback for blood. The patient tolerated the procedure well. Post procedure instructions were given. A followup appointment was made.

## 2024-01-02 ENCOUNTER — Ambulatory Visit (INDEPENDENT_AMBULATORY_CARE_PROVIDER_SITE_OTHER): Payer: 59 | Admitting: Neurology

## 2024-01-02 ENCOUNTER — Encounter: Payer: Self-pay | Admitting: Neurology

## 2024-01-02 VITALS — BP 113/78 | HR 81 | Wt 150.0 lb

## 2024-01-02 DIAGNOSIS — R569 Unspecified convulsions: Secondary | ICD-10-CM | POA: Diagnosis not present

## 2024-01-02 DIAGNOSIS — G40209 Localization-related (focal) (partial) symptomatic epilepsy and epileptic syndromes with complex partial seizures, not intractable, without status epilepticus: Secondary | ICD-10-CM | POA: Diagnosis not present

## 2024-01-02 DIAGNOSIS — Z299 Encounter for prophylactic measures, unspecified: Secondary | ICD-10-CM | POA: Diagnosis not present

## 2024-01-02 MED ORDER — CARBAMAZEPINE ER 300 MG PO CP12: 300.0000 mg | ORAL_CAPSULE | Freq: Three times a day (TID) | ORAL | 3 refills | Status: DC

## 2024-01-02 NOTE — Progress Notes (Signed)
 NEUROLOGY FOLLOW UP OFFICE NOTE  OZZIE KNOBEL [id number] 12-Feb-1971  HISTORY OF PRESENT ILLNESS: I had the pleasure of seeing Hutton Pellicane in follow-up in the neurology clinic on 01/02/2024.  The patient was last seen 7 months ago for seizures. He is accompanied by group home staff [facility] who helps supplement the history today.  Records and images were personally reviewed where available. I personally reviewed head CT without contrast done 06/2023 which there was encephalomalacia and calcifications in the anterior frontal lobes, age advanced cerebellar atrophy. He was kindly seen by Dr. Wynn Banker for spastic quadriparesis and started Botox this week.   Since his last visit, they deny any seizures since April 2023 on Carbatrol 300mg  TID, no side effects. No episodes of unresponsiveness or body jerking. He denies any pain. No headaches, dizziness, no falls. Sleep and appetite are good, he feeds himself. Behaviors are stable per staff. He is wheelchair-bound.    History on Initial Assessment 06/08/2023: This is a pleasant 53 year old man with a history of developmental delay, cerebral palsy, and seizures, presenting to establish care after his neurologist retired. Prior records from Dr. Gerilyn Pilgrim unavailable for review. Sharlet Salina reports his sister is his guardian, he has been at the same group home for 25-30 years Sharlet Salina has known him for 4 years and had not witnessed any seizures until he was doing his colonoscopy prep in April 2023. He got very agitated, started screaming and cursing, then he stared off and had a convulsion with significant tongue bite. He was post-ictal for 25-30 minutes. No other staring spells. He denies any headaches, dizziness, sleep and appetite are good. No swallowing difficulties. He is on carbamazepine ER 300mg  TID. He is also on Gabapentin 800mg  four times a day and Diazepam 5mg  BID.  He has behavioral issues, destructive of his own things, hitting certain staff members where he  gets loud and curses around once a week. He is non-ambulatory but would throw himself out of his chair, Ayana thinks he is confused why his legs go stiff. He complains about his legs a lot. Ayana reports a change in the past 4-5 months. In the past he would be able to stand longer, he would be able to stand for around 2 minutes during showers so they can wash him, but now he can only stand for 5 seconds then sit down. He used to be able to wheel himself to the bathroom and hold on the bar, rotate himself to sit on the commode, now staff has to constantly remind him every 30 minutes about using the bathroom, he says he does not need to go but would soil his diapers. He cannot rotate himself onto the commode any more.   Per notes: "From note of 07/1993, intellectual testing in 4/199 showed a WAIS-R verbal IQ of 7, performance IQ 3 and fuls-scale 52, wheelchair-bound, not ambulatory."  I personally reviewed head CT without contrast done 5/105 which did not show any acute changes, there is diffuse atrophy with significant cerebellar atrophy, calcification and encephalomalacia at the medial frontal lobes bilaterally, compatible with old ACA infarcts, slight prominence of the ventricular system stable since 2011 imaging.   PAST MEDICAL HISTORY: Past Medical History:  Diagnosis Date   Anxiety    Cerebral palsy (HCC)    Depression    GERD (gastroesophageal reflux disease)    Mental retardation    Seizures (HCC)     MEDICATIONS: Current Outpatient Medications on File Prior to Visit  Medication Sig Dispense  Refill   ARIPiprazole (ABILIFY) 5 MG tablet Take 5 mg by mouth every morning.     carbamazepine (CARBATROL) 300 MG 12 hr capsule Take 1 capsule (300 mg total) by mouth 3 (three) times daily. 270 capsule 3   clotrimazole (LOTRIMIN) 1 % cream Apply 1 application  topically daily at 6 (six) AM.     cromolyn (OPTICROM) 4 % ophthalmic solution SMARTSIG:In Eye(s)     diazepam (VALIUM) 5 MG tablet Take 5  mg by mouth 2 (two) times daily. (0800 & 2000)     fluvoxaMINE (LUVOX) 100 MG tablet Take 100 mg by mouth 2 (two) times daily. (0800 & 2000)     gabapentin (NEURONTIN) 800 MG tablet Take 800 mg by mouth in the morning, at noon, in the evening, and at bedtime. (0800, 1200, 1600 & 2000)     GOODSENSE CLEARLAX 17 GM/SCOOP powder Take 17 g by mouth in the morning.     hydrochlorothiazide (HYDRODIURIL) 12.5 MG tablet Take 12.5 mg by mouth daily. (Patient taking differently: Take 25 mg by mouth daily.)     Incontinence Supply Disposable (PREVAIL BOXERS FOR MEN S-M) MISC by Miscellaneous route.     omeprazole (PRILOSEC) 20 MG capsule TAKE 1 CAPSULE BY MOUTH TWICE DAILY BEFORE A MEAL (Patient taking differently: Take 20 mg by mouth in the morning and at bedtime. (0800 & 1700)) 60 capsule 11   PATADAY 0.7 % SOLN      potassium chloride (KLOR-CON) 10 MEQ tablet Take 8 mEq by mouth daily.     tamsulosin (FLOMAX) 0.4 MG CAPS capsule Take 0.4 mg by mouth in the morning.     TERBINAFINE EX Apply topically. BID fungus on toenails     triamcinolone cream (KENALOG) 0.1 % Apply 1 application topically 2 (two) times daily. Apply to hands (0800 & 2000)     Wheat Dextrin (BENEFIBER) POWD Mix 1 tablespoon in 8 ounces of water and give twice daily. 245 g 1   zolpidem (AMBIEN) 5 MG tablet Take 5 mg by mouth at bedtime.     No current facility-administered medications on file prior to visit.    ALLERGIES: No Known Allergies  FAMILY HISTORY: Family History  Family history unknown: Yes    SOCIAL HISTORY: Social History   Socioeconomic History   Marital status: Single    Spouse name: Not on file   Number of children: Not on file   Years of education: Not on file   Highest education level: Not on file  Occupational History   Not on file  Tobacco Use   Smoking status: Never   Smokeless tobacco: Never  Vaping Use   Vaping status: Never Used  Substance and Sexual Activity   Alcohol use: No   Drug use: No    Sexual activity: Never    Birth control/protection: None  Other Topics Concern   Not on file  Social History Narrative   [facility]       Right hand       Social Drivers of Health   Financial Resource Strain: Not on file  Food Insecurity: Not on file  Transportation Needs: Not on file  Physical Activity: Not on file  Stress: Not on file  Social Connections: Not on file  Intimate Partner Violence: Not on file     PHYSICAL EXAM: Vitals:   01/02/24 0943  BP: 113/78  Pulse: 81  SpO2: 93%   General: No acute distress, sitting on wheelchair Head:  Normocephalic/atraumatic Skin/Extremities: No rash, no edema Neurological Exam: alert and awake. He is able to answer simple questions and follow one-step commands. Attention and concentration are normal.   Cranial nerves: Pupils equal, round. +strabismus with left eye deviated medially on primary gaze, restricted lateral gaze bilaterally. Visual fields full, able to count fingers.  No facial asymmetry.  Motor: increased tone on both LE, 5/5 both UE, 3/5 bilateral hip flexion, L>R. Reflexes +1 throughout. Ataxia on finger to nose testing R>L. Gait not tested.    IMPRESSION: This is a pleasant 53 year old man with a history of developmental delay, cerebral palsy, and seizures. Staff has known him for 4 years and has only witnessed one focal to bilateral tonic-clonic seizure in the setting of colonoscopy prep in April 2023. No further seizures since then, continue Carbamazepine ER 300mg  TID, refills sent. He is also on Gabapentin and Diazepam prescribed by his psychiatrist, behaviors stable per staff. Continue follow-up with Dr. Wynn Banker for Botox. Continue 24/7 care. Follow-up in 1 year, call for any changes.    Thank you for allowing me to participate in his care.  Please do not hesitate to call for any questions or concerns.    Patrcia Dolly, M.D.   CC: Dr. Sherryll Burger

## 2024-01-02 NOTE — Patient Instructions (Addendum)
Good to see you doing well. Continue all your medications. Follow-up in 1 year, call for any changes.    Seizure Precautions: 1. If medication has been prescribed for you to prevent seizures, take it exactly as directed.  Do not stop taking the medicine without talking to your doctor first, even if you have not had a seizure in a long time.   2. Avoid activities in which a seizure would cause danger to yourself or to others.  Don't operate dangerous machinery, swim alone, or climb in high or dangerous places, such as on ladders, roofs, or girders.  Do not drive unless your doctor says you may.  3. If you have any warning that you may have a seizure, lay down in a safe place where you can't hurt yourself.    4.  No driving for 6 months from last seizure, as per [facility].   Please refer to the following link on the [employer]'s website for more information: http://www.epilepsyfoundation.org/answerplace/Social/driving/drivingu.cfm   5.  Maintain good sleep hygiene.  6.  Contact your doctor if you have any problems that may be related to the medicine you are taking.  7.  Call 911 and bring the patient back to the ED if:        A.  The seizure lasts longer than 5 minutes.       B.  The patient doesn't awaken shortly after the seizure  C.  The patient has new problems such as difficulty seeing, speaking or moving  D.  The patient was injured during the seizure  E.  The patient has a temperature over 102 F (39C)  F.  The patient vomited and now is having trouble breathing

## 2024-01-04 DIAGNOSIS — R1011 Right upper quadrant pain: Secondary | ICD-10-CM | POA: Diagnosis not present

## 2024-01-24 ENCOUNTER — Ambulatory Visit: Payer: Self-pay | Admitting: Internal Medicine

## 2024-01-30 DIAGNOSIS — Z0000 Encounter for general adult medical examination without abnormal findings: Secondary | ICD-10-CM | POA: Diagnosis not present

## 2024-01-30 DIAGNOSIS — E7800 Pure hypercholesterolemia, unspecified: Secondary | ICD-10-CM | POA: Diagnosis not present

## 2024-01-30 DIAGNOSIS — R5383 Other fatigue: Secondary | ICD-10-CM | POA: Diagnosis not present

## 2024-01-30 DIAGNOSIS — Z299 Encounter for prophylactic measures, unspecified: Secondary | ICD-10-CM | POA: Diagnosis not present

## 2024-01-30 DIAGNOSIS — Z79899 Other long term (current) drug therapy: Secondary | ICD-10-CM | POA: Diagnosis not present

## 2024-02-05 ENCOUNTER — Ambulatory Visit (INDEPENDENT_AMBULATORY_CARE_PROVIDER_SITE_OTHER): Payer: Self-pay

## 2024-02-05 VITALS — BP 110/73 | HR 78 | Ht 63.0 in | Wt 150.0 lb

## 2024-02-05 DIAGNOSIS — G800 Spastic quadriplegic cerebral palsy: Secondary | ICD-10-CM

## 2024-02-05 NOTE — Progress Notes (Unsigned)
 New Patient Office Visit  Subjective    Patient ID: Tom Russell, male    DOB: [birth year]  Age: 53 y.o. MRN: [id number]  CC:  Chief Complaint  Patient presents with   Establish Care    Pt here to establish care    HPI Tom Russell presents to establish care I had the pleasure of meeting  Tom Russell today to establish care.   He is accompanied by group home staff Tom Russell who helps supplement the history today.  Records were personally reviewed where available from his specialists.     He and his caregiver deny any pain at this time.  No headaches, dizziness, no falls. Sleep and appetite are good, he feeds himself. Behaviors are stable per staff. He is wheelchair-bound.  Outpatient Encounter Medications as of 02/05/2024  Medication Sig   ARIPiprazole (ABILIFY) 5 MG tablet Take 5 mg by mouth every morning.   carbamazepine  (CARBATROL ) 300 MG 12 hr capsule Take 1 capsule (300 mg total) by mouth 3 (three) times daily.   clotrimazole (LOTRIMIN) 1 % cream Apply 1 application  topically daily at 6 (six) AM.   cromolyn (OPTICROM) 4 % ophthalmic solution SMARTSIG:In Eye(s)   diazepam (VALIUM) 5 MG tablet Take 5 mg by mouth 2 (two) times daily. (0800 & 2000)   fluvoxaMINE (LUVOX) 100 MG tablet Take 100 mg by mouth 2 (two) times daily. (0800 & 2000)   gabapentin (NEURONTIN) 800 MG tablet Take 800 mg by mouth in the morning, at noon, in the evening, and at bedtime. (0800, 1200, 1600 & 2000)   GOODSENSE CLEARLAX 17 GM/SCOOP powder Take 17 g by mouth in the morning.   hydrochlorothiazide (HYDRODIURIL) 12.5 MG tablet Take 12.5 mg by mouth daily. (Patient taking differently: Take 25 mg by mouth daily.)   Incontinence Supply Disposable (PREVAIL BOXERS FOR MEN S-M) MISC by Miscellaneous route.   omeprazole  (PRILOSEC) 20 MG capsule TAKE 1 CAPSULE BY MOUTH TWICE DAILY BEFORE A MEAL (Patient taking differently: Take 20 mg by mouth in the morning and at bedtime. (0800 & 1700))   PATADAY 0.7 % SOLN     potassium chloride (KLOR-CON) 10 MEQ tablet Take 8 mEq by mouth daily.   senna (SENOKOT) 8.6 MG TABS tablet Take 1 tablet by mouth daily.   tamsulosin (FLOMAX) 0.4 MG CAPS capsule Take 0.4 mg by mouth in the morning.   TERBINAFINE EX Apply topically. BID fungus on toenails   triamcinolone cream (KENALOG) 0.1 % Apply 1 application topically 2 (two) times daily. Apply to hands (0800 & 2000)   Wheat Dextrin (BENEFIBER) POWD Mix 1 tablespoon in 8 ounces of water  and give twice daily.   zolpidem (AMBIEN) 5 MG tablet Take 5 mg by mouth at bedtime.   No facility-administered encounter medications on file as of 02/05/2024.    Past Medical History:  Diagnosis Date   Anxiety    Cerebral palsy (HCC)    Depression    GERD (gastroesophageal reflux disease)    Mental retardation    Seizures (HCC)     Past Surgical History:  Procedure Laterality Date   COLONOSCOPY WITH PROPOFOL  N/A 05/24/2023   Procedure: COLONOSCOPY WITH PROPOFOL ;  Surgeon: Suzette Espy, MD;  Location: [facility];  Service: Endoscopy;  Laterality: N/A;  930am, asa 3, group home patient   ESOPHAGOGASTRODUODENOSCOPY (EGD) WITH PROPOFOL  N/A 11/23/2017   normal esophagus, single 4 mm semi-sessile polyp in gastric fundus, duodenal bulb normal, s/p biopsy. Fundic polyp, negative H.pylori.  None to date     as of 11/14/16    Family History  Family history unknown: Yes    Social History   Socioeconomic History   Marital status: Single    Spouse name: Not on file   Number of children: Not on file   Years of education: Not on file   Highest education level: Not on file  Occupational History   Not on file  Tobacco Use   Smoking status: Never   Smokeless tobacco: Never  Vaping Use   Vaping status: Never Used  Substance and Sexual Activity   Alcohol  use: No   Drug use: No   Sexual activity: Never    Birth control/protection: None  Other Topics Concern   Not on file  Social History Narrative   [facility]  2       Right hand       Social Drivers of Health   Financial Resource Strain: Not on file  Food Insecurity: No Food Insecurity (02/05/2024)   Hunger Vital Sign    Worried About Running Out of Food in the Last Year: Never true    Ran Out of Food in the Last Year: Never true  Transportation Needs: No Transportation Needs (02/05/2024)   PRAPARE - Administrator, Civil Service (Medical): No    Lack of Transportation (Non-Medical): No  Physical Activity: Not on file  Stress: Not on file  Social Connections: Not on file  Intimate Partner Violence: Not At Risk (02/05/2024)   Humiliation, Afraid, Rape, and Kick questionnaire    Fear of Current or Ex-Partner: No    Emotionally Abused: No    Physically Abused: No    Sexually Abused: No    ROS      Objective    BP 110/73   Pulse 78   Ht 5\' 3"  (1.6 m)   Wt 150 lb (68 kg)   SpO2 90%   BMI 26.57 kg/m   Physical Exam Vitals and nursing note reviewed. Exam conducted with a chaperone present Tom Russell, group home staff member).  Constitutional:      Appearance: Normal appearance.  HENT:     Head: Normocephalic.  Eyes:     Extraocular Movements: Extraocular movements intact.     Pupils: Pupils are equal, round, and reactive to light.  Cardiovascular:     Rate and Rhythm: Normal rate and regular rhythm.  Pulmonary:     Effort: Pulmonary effort is normal.     Breath sounds: Normal breath sounds.  Abdominal:     General: Bowel sounds are normal.     Palpations: Abdomen is soft.  Musculoskeletal:     Right lower leg: No edema.     Left lower leg: No edema.  Neurological:     Mental Status: He is alert and oriented to person, place, and time.     Gait: Gait abnormal (WC bound).  Psychiatric:        Mood and Affect: Mood normal.        Thought Content: Thought content normal.         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  He has numerous specialists for his CP.  He is here today with caregiver from group home.  Both deny any new concerns or issues  at this time.  Recommend f/u for AWV in the near future.        Return in about 6 months (around 08/06/2024) for for yearly physical.   Alison Irvine, FNP

## 2024-02-05 NOTE — Patient Instructions (Signed)
 Recommend follow-up in 6 months or sooner if needed.    Needs Medicare AWV.

## 2024-02-06 NOTE — Assessment & Plan Note (Signed)
 Pt here to establish care with PCP.  He has numerous specialists for his CP.  He is here today with caregiver from group home.  Both deny any new concerns or issues at this time.  Recommend f/u for AWV in the near future.

## 2024-02-09 ENCOUNTER — Encounter: Payer: MEDICAID | Admitting: Physical Medicine & Rehabilitation

## 2024-02-21 ENCOUNTER — Ambulatory Visit (INDEPENDENT_AMBULATORY_CARE_PROVIDER_SITE_OTHER): Payer: MEDICAID

## 2024-02-21 VITALS — BP 118/81 | HR 74 | Ht 68.0 in | Wt 150.0 lb

## 2024-02-21 DIAGNOSIS — R300 Dysuria: Secondary | ICD-10-CM | POA: Diagnosis not present

## 2024-02-21 NOTE — Progress Notes (Unsigned)
   Acute Office Visit  Subjective:     Patient ID: Tom Russell, male    DOB: 02-25-1971, 53 y.o.   MRN: [id number]  Chief Complaint  Patient presents with   Medical Management of Chronic Issues    Burning while urinating    HPI Patient is in today for frequent urination and burning.   ROS      Objective:    BP 118/81   Pulse 74   Ht 5\' 8"  (1.727 m)   Wt 150 lb (68 kg)   SpO2 (!) 88%   BMI 22.81 kg/m    Physical Exam Vitals and nursing note reviewed. Exam conducted with a chaperone present Ivie Maroon, group home staff member).  Constitutional:      Appearance: Normal appearance.  HENT:     Head: Normocephalic.  Eyes:     Extraocular Movements: Extraocular movements intact.     Pupils: Pupils are equal, round, and reactive to light.  Cardiovascular:     Rate and Rhythm: Normal rate and regular rhythm.  Pulmonary:     Effort: Pulmonary effort is normal.     Breath sounds: Normal breath sounds.  Abdominal:     General: Bowel sounds are normal.     Palpations: Abdomen is soft.     Tenderness: There is no abdominal tenderness. There is no right CVA tenderness or left CVA tenderness.  Musculoskeletal:     Right lower leg: No edema.     Left lower leg: No edema.  Neurological:     Mental Status: He is alert. Mental status is at baseline.     Gait: Gait abnormal (WC bound).  Psychiatric:        Behavior: Behavior is cooperative.        Assessment & Plan:   Problem List Items Addressed This Visit   None Visit Diagnoses       Dysuria    -  Primary   obtain culture for further evaluation.   Relevant Orders   Urine Culture (Completed)       No orders of the defined types were placed in this encounter.   No follow-ups on file.  Alison Irvine, FNP

## 2024-02-23 ENCOUNTER — Ambulatory Visit: Payer: Self-pay

## 2024-02-23 LAB — URINE CULTURE

## 2024-03-12 ENCOUNTER — Encounter: Payer: MEDICAID | Attending: Physical Medicine & Rehabilitation | Admitting: Physical Medicine & Rehabilitation

## 2024-03-12 ENCOUNTER — Encounter: Payer: Self-pay | Admitting: Physical Medicine & Rehabilitation

## 2024-03-12 VITALS — BP 136/93 | HR 70 | Ht 68.0 in | Wt 150.0 lb

## 2024-03-12 DIAGNOSIS — G800 Spastic quadriplegic cerebral palsy: Secondary | ICD-10-CM | POA: Diagnosis not present

## 2024-03-12 NOTE — Progress Notes (Signed)
 Subjective:    Patient ID: Tom Russell, male    DOB: 01/19/1971, 53 y.o.   MRN: [id number] 53 yo male with CP referred by neurology to evaluate for botulinum toxin injections to help with lower extremity spasticity.  He underwent botulinum toxin injections approximately 2.5 months ago.  According the patient as well as his caregivers they have noted some improvement with knee extension.  He has noted that his knees are tending to buckle a little bit more now as the medications are wear off. 12/18/23 Botox  Right  Hamstrings200 Left  Hamstrings 200 HPI  Pain Inventory Average Pain 4 Pain Right Now 0 My pain is intermittent and tingling  In the last 24 hours, has pain interfered with the following? General activity 0 Relation with others 0 Enjoyment of life 0 What TIME of day is your pain at its worst? morning  Sleep (in general) Good  Pain is worse with: inactivity and some activites Pain improves with: rest, therapy/exercise, medication, and injections Relief from Meds: 7  Family History  Family history unknown: Yes   Social History   Socioeconomic History   Marital status: Single    Spouse name: Not on file   Number of children: Not on file   Years of education: Not on file   Highest education level: Not on file  Occupational History   Not on file  Tobacco Use   Smoking status: Never   Smokeless tobacco: Never  Vaping Use   Vaping status: Never Used  Substance and Sexual Activity   Alcohol  use: No   Drug use: No   Sexual activity: Never    Birth control/protection: None  Other Topics Concern   Not on file  Social History Narrative   [facility]       Right hand       Social Drivers of Health   Financial Resource Strain: Not on file  Food Insecurity: No Food Insecurity (02/05/2024)   Hunger Vital Sign    Worried About Running Out of Food in the Last Year: Never true    Ran Out of Food in the Last Year: Never true  Transportation Needs:  No Transportation Needs (02/05/2024)   PRAPARE - Administrator, Civil Service (Medical): No    Lack of Transportation (Non-Medical): No  Physical Activity: Not on file  Stress: Not on file  Social Connections: Not on file   Past Surgical History:  Procedure Laterality Date   COLONOSCOPY WITH PROPOFOL  N/A 05/24/2023   Procedure: COLONOSCOPY WITH PROPOFOL ;  Surgeon: Suzette Espy, MD;  Location: [facility];  Service: Endoscopy;  Laterality: N/A;  930am, asa 3, group home patient   ESOPHAGOGASTRODUODENOSCOPY (EGD) WITH PROPOFOL  N/A 11/23/2017   normal esophagus, single 4 mm semi-sessile polyp in gastric fundus, duodenal bulb normal, s/p biopsy. Fundic polyp, negative H.pylori.    None to date     as of 11/14/16   Past Surgical History:  Procedure Laterality Date   COLONOSCOPY WITH PROPOFOL  N/A 05/24/2023   Procedure: COLONOSCOPY WITH PROPOFOL ;  Surgeon: Suzette Espy, MD;  Location: [facility];  Service: Endoscopy;  Laterality: N/A;  930am, asa 3, group home patient   ESOPHAGOGASTRODUODENOSCOPY (EGD) WITH PROPOFOL  N/A 11/23/2017   normal esophagus, single 4 mm semi-sessile polyp in gastric fundus, duodenal bulb normal, s/p biopsy. Fundic polyp, negative H.pylori.    None to date     as of 11/14/16   Past Medical History:  Diagnosis Date   Anxiety    Cerebral palsy (HCC)    Depression    GERD (gastroesophageal reflux disease)    Mental retardation    Seizures (HCC)    BP (!) 136/93   Pulse 70   Ht 5\' 8"  (1.727 m)   Wt 150 lb (68 kg) Comment: last recorded  SpO2 94%   BMI 22.81 kg/m   Opioid Risk Score:   Fall Risk Score:  `1  Depression screen [facility] 2/9     03/12/2024   12:41 PM 02/05/2024    9:38 AM 12/29/2023   10:49 AM  Depression screen PHQ 2/9  Decreased Interest 0 0 0  Down, Depressed, Hopeless 0 0 0  PHQ - 2 Score 0 0 0  Altered sleeping  0   Tired, decreased energy  0   Change in appetite  0   Feeling bad or failure about yourself   0   Trouble  concentrating  1   Moving slowly or fidgety/restless  1   Suicidal thoughts  0   PHQ-9 Score  2   Difficult doing work/chores  Not difficult at all      Review of Systems  Musculoskeletal:  Positive for gait problem.       Knees and legs  All other systems reviewed and are negative.      Objective:   Physical Exam General No acute distress mood affect appropriate 3 - hip flexion bilaterally 3 - knee extension bilaterally Tone MAS 3 in the right knee flexors MAS 3 in the left knee flexors There is incomplete range of motion with extension       Assessment & Plan:  1.  Cerebral palsy with spastic quadriparesis affecting lower extremities right upper extremity.  He did get some improvement with his knee flexor spasticity after botulinum toxin injection of both hamstrings.  Will schedule for repeat in the next month.  He would likely benefit from every 71-month schedule.

## 2024-03-14 ENCOUNTER — Ambulatory Visit: Payer: Self-pay

## 2024-03-14 NOTE — Telephone Encounter (Signed)
 Copied from CRM [id number]. Topic: Clinical - Red Word Triage >> Mar 14, 2024  2:51 PM Ivette P wrote: Red Word that prompted transfer to Nurse Triage: pt having a swelling in the feet. are pretty chunky. Reason for Disposition  Ankle swelling is a chronic symptom (recurrent or ongoing AND present > 4 weeks)  Answer Assessment - Initial Assessment Questions 1. LOCATION: "Which ankle is swollen?" "Where is the swelling?"     Both feet swollen 2. ONSET: "When did the swelling start?"     today 3. SWELLING: "How bad is the swelling?" Or, "How large is it?" (e.g., mild, moderate, severe; size of localized swelling)    - NONE: No joint swelling.   - LOCALIZED: Localized; small area of puffy or swollen skin (e.g., insect bite, skin irritation).   - MILD: Joint looks or feels mildly swollen or puffy.   - MODERATE: Swollen; interferes with normal activities (e.g., work or school); decreased range of movement; may be limping.   - SEVERE: Very swollen; can't move swollen joint at all; limping a lot or unable to walk.     moderate 4. PAIN: "Is there any pain?" If Yes, ask: "How bad is it?" (Scale 1-10; or mild, moderate, severe)   - NONE (0): no pain.   - MILD (1-3): doesn't interfere with normal activities.    - MODERATE (4-7): interferes with normal activities (e.g., work or school) or awakens from sleep, limping.    - SEVERE (8-10): excruciating pain, unable to do any normal activities, unable to walk.      no 5. CAUSE: "What do you think caused the ankle swelling?"     unknown 6. OTHER SYMPTOMS: "Do you have any other symptoms?" (e.g., fever, chest pain, difficulty breathing, calf pain)     No   Patient with epilepsy, spastic quadriplegia, WC dependent  Protocols used: Ankle Swelling-A-AH

## 2024-03-14 NOTE — Telephone Encounter (Signed)
 Caller was initially on the phone with another E2C2 RN but call dropped prior to scheduling. As this Triager was reviewing notes, call was dropped again. Will place in CB folder for NT to CB and finish scheduling.

## 2024-03-26 DIAGNOSIS — M79672 Pain in left foot: Secondary | ICD-10-CM | POA: Diagnosis not present

## 2024-03-26 DIAGNOSIS — I739 Peripheral vascular disease, unspecified: Secondary | ICD-10-CM | POA: Diagnosis not present

## 2024-03-26 DIAGNOSIS — M79674 Pain in right toe(s): Secondary | ICD-10-CM | POA: Diagnosis not present

## 2024-03-26 DIAGNOSIS — L110 Acquired keratosis follicularis: Secondary | ICD-10-CM | POA: Diagnosis not present

## 2024-03-26 DIAGNOSIS — M79671 Pain in right foot: Secondary | ICD-10-CM | POA: Diagnosis not present

## 2024-03-26 DIAGNOSIS — M79675 Pain in left toe(s): Secondary | ICD-10-CM | POA: Diagnosis not present

## 2024-04-11 ENCOUNTER — Encounter: Payer: Self-pay | Admitting: Physical Medicine & Rehabilitation

## 2024-04-11 ENCOUNTER — Encounter: Attending: Physical Medicine & Rehabilitation | Admitting: Physical Medicine & Rehabilitation

## 2024-04-11 VITALS — BP 124/81 | Ht 68.0 in | Wt 165.0 lb

## 2024-04-11 DIAGNOSIS — G800 Spastic quadriplegic cerebral palsy: Secondary | ICD-10-CM | POA: Insufficient documentation

## 2024-04-11 MED ORDER — SODIUM CHLORIDE (PF) 0.9 % IJ SOLN
8.0000 mL | Freq: Once | INTRAMUSCULAR | Status: AC
Start: 2024-04-11 — End: 2024-04-11
  Administered 2024-04-11: 8 mL via INTRAVENOUS

## 2024-04-11 MED ORDER — ONABOTULINUMTOXINA 100 UNITS IJ SOLR
100.0000 [IU] | Freq: Once | INTRAMUSCULAR | Status: AC
Start: 2024-04-11 — End: 2024-04-11
  Administered 2024-04-11: 100 [IU] via INTRAMUSCULAR

## 2024-04-11 NOTE — Progress Notes (Signed)
 Botox Injection for spasticity using needle EMG guidance  Dilution: 50 Units/ml Indication: Severe spasticity which interferes with ADL,mobility and/or  hygiene and is unresponsive to medication management and other conservative care Informed consent was obtained after describing risks and benefits of the procedure with the patient. This includes bleeding, bruising, infection, excessive weakness, or medication side effects. A REMS form is on file and signed. Needle: 50mm 25g needle electrode Number of units per muscle Right  Hamstrings200 Left  Hamstrings 200 All injections were done after obtaining appropriate EMG activity and after negative drawback for blood. The patient tolerated the procedure well. Post procedure instructions were given. A followup appointment was made.

## 2024-04-15 NOTE — Progress Notes (Deleted)
 GI Office Note    Referring Provider: Maree Isles, MD Primary Care Physician:  Bevely Doffing, FNP Primary Gastroenterologist: Tom Russell.Rourk, MD  Date:  04/15/2024  ID:  Tom Russell, DOB 05-Aug-1971, MRN [id number]  Chief Complaint   No chief complaint on file.  History of Present Illness  Tom Russell is a 53 y.o. male with a history of CP, GERD, anxiety, depression, and seizures presenting today with complaint of ***  OV 04/11/2023.  Caregiver reported having too many loose stools with MiraLAX and was having more accidents.  Patient unable to tell when he has done a bowel movement.  Having some lower abdominal pain.  Has a good appetite.  MiraLAX was placed as needed given to me looser stools.  Patient reportedly talked to his sister about a colonoscopy but she did not say anything about it.  Denied any nausea or vomiting.  Doing okay in regards to swallowing.  Reflux controlled.  Advise continue omeprazole  20 mg twice daily, GERD diet.  Keep MiraLAX as needed and start Metamucil or Benefiber daily.  Flexible sigmoidoscopy scheduled..   Colonoscopy 05/24/2023: -Redundant and elongated colon -Entire examined colon norma   OV 09/12/23.  Sometimes able to go to the restroom, sometimes cannot.  Does have to strain at times.  Having pain in the lower abdomen mostly with bowel movements.  Not having as many accidents as previously.  Has a good appetite and eating well.  Denied any epigastric pain or burning, nausea, vomiting, or dysphagia. Advised to continue omeprazole  20 mg twice daily, famotidine 40 mg nightly, GERD diet.  Continue MiraLAX daily as well as starting Senokot nightly and started daily fiber supplementation once in the mornings.   Last office visit 12/13/23.On schedule for toileting every 30 minutes. Considering botox  injections for him in regards to decreased sensation. Reflux doing well with meds BID and diet changes. No brbpr or melena. Advised to continue PT and toileting  schedule. Increase metamucil to BID. F/u 4 months.     Today:    Wt Readings from Last 5 Encounters:  04/11/24 165 lb (74.8 kg)  03/12/24 150 lb (68 kg)  02/21/24 150 lb (68 kg)  02/05/24 150 lb (68 kg)  01/02/24 150 lb (68 kg)    Current Outpatient Medications  Medication Sig Dispense Refill   ARIPiprazole (ABILIFY) 5 MG tablet Take 5 mg by mouth every morning.     carbamazepine  (CARBATROL ) 300 MG 12 hr capsule Take 1 capsule (300 mg total) by mouth 3 (three) times daily. 270 capsule 3   clotrimazole (LOTRIMIN) 1 % cream Apply 1 application  topically daily at 6 (six) AM.     cromolyn (OPTICROM) 4 % ophthalmic solution SMARTSIG:In Eye(s)     diazepam (VALIUM) 5 MG tablet Take 5 mg by mouth 2 (two) times daily. (0800 & 2000)     fluvoxaMINE (LUVOX) 100 MG tablet Take 100 mg by mouth 2 (two) times daily. (0800 & 2000)     gabapentin (NEURONTIN) 800 MG tablet Take 800 mg by mouth in the morning, at noon, in the evening, and at bedtime. (0800, 1200, 1600 & 2000)     GOODSENSE CLEARLAX 17 GM/SCOOP powder Take 17 g by mouth in the morning.     hydrochlorothiazide (HYDRODIURIL) 12.5 MG tablet Take 12.5 mg by mouth daily. (Patient taking differently: Take 25 mg by mouth daily.)     Incontinence Supply Disposable (PREVAIL BOXERS FOR MEN S-M) MISC by Miscellaneous route.  omeprazole  (PRILOSEC) 20 MG capsule TAKE 1 CAPSULE BY MOUTH TWICE DAILY BEFORE A MEAL (Patient taking differently: Take 20 mg by mouth in the morning and at bedtime. (0800 & 1700)) 60 capsule 11   PATADAY 0.7 % SOLN      potassium chloride (KLOR-CON) 10 MEQ tablet Take 8 mEq by mouth daily.     senna (SENOKOT) 8.6 MG TABS tablet Take 1 tablet by mouth daily.     tamsulosin (FLOMAX) 0.4 MG CAPS capsule Take 0.4 mg by mouth in the morning.     TERBINAFINE EX Apply topically. BID fungus on toenails     triamcinolone cream (KENALOG) 0.1 % Apply 1 application topically 2 (two) times daily. Apply to hands (0800 & 2000)      Wheat Dextrin (BENEFIBER) POWD Mix 1 tablespoon in 8 ounces of water  and give twice daily. 245 g 1   zolpidem (AMBIEN) 5 MG tablet Take 5 mg by mouth at bedtime.     No current facility-administered medications for this visit.    Past Medical History:  Diagnosis Date   Anxiety    Cerebral palsy (HCC)    Depression    GERD (gastroesophageal reflux disease)    Mental retardation    Seizures (HCC)     Past Surgical History:  Procedure Laterality Date   COLONOSCOPY WITH PROPOFOL  N/A 05/24/2023   Procedure: COLONOSCOPY WITH PROPOFOL ;  Surgeon: Shaaron Tom HERO, MD;  Location: [facility];  Service: Endoscopy;  Laterality: N/A;  930am, asa 3, group home patient   ESOPHAGOGASTRODUODENOSCOPY (EGD) WITH PROPOFOL  N/A 11/23/2017   normal esophagus, single 4 mm semi-sessile polyp in gastric fundus, duodenal bulb normal, s/p biopsy. Fundic polyp, negative H.pylori.    None to date     as of 11/14/16    Family History  Family history unknown: Yes    Allergies as of 04/16/2024   (No Known Allergies)    Social History   Socioeconomic History   Marital status: Single    Spouse name: Not on file   Number of children: Not on file   Years of education: Not on file   Highest education level: Not on file  Occupational History   Not on file  Tobacco Use   Smoking status: Never   Smokeless tobacco: Never  Vaping Use   Vaping status: Never Used  Substance and Sexual Activity   Alcohol  use: No   Drug use: No   Sexual activity: Never    Birth control/protection: None  Other Topics Concern   Not on file  Social History Narrative   [facility]       Right hand       Social Drivers of Health   Financial Resource Strain: Not on file  Food Insecurity: No Food Insecurity (02/05/2024)   Hunger Vital Sign    Worried About Running Out of Food in the Last Year: Never true    Ran Out of Food in the Last Year: Never true  Transportation Needs: No Transportation Needs  (02/05/2024)   PRAPARE - Administrator, Civil Service (Medical): No    Lack of Transportation (Non-Medical): No  Physical Activity: Not on file  Stress: Not on file  Social Connections: Not on file     Review of Systems   Gen: Denies fever, chills, anorexia. Denies fatigue, weakness, weight loss.  CV: Denies chest pain, palpitations, syncope, peripheral edema, and claudication. Resp: Denies dyspnea at rest, cough, wheezing, coughing up  blood, and pleurisy. GI: See HPI Derm: Denies rash, itching, dry skin Psych: Denies depression, anxiety, memory loss, confusion. No homicidal or suicidal ideation.  Heme: Denies bruising, bleeding, and enlarged lymph nodes.  Physical Exam   There were no vitals taken for this visit.  General:   Alert and oriented. No distress noted. Pleasant and cooperative.  Head:  Normocephalic and atraumatic. Eyes:  Conjuctiva clear without scleral icterus. Mouth:  Oral mucosa pink and moist. Good dentition. No lesions. Lungs:  Clear to auscultation bilaterally. No wheezes, rales, or rhonchi. No distress.  Heart:  S1, S2 present without murmurs appreciated.  Abdomen:  +BS, soft, non-tender and non-distended. No rebound or guarding. No HSM or masses noted. Rectal: *** Msk:  Symmetrical without gross deformities. Normal posture. Extremities:  Without edema. Neurologic:  Alert and  oriented x4 Psych:  Alert and cooperative. Normal mood and affect.  Assessment  FURQAN GOSSELIN is a 53 y.o. male presenting today with ***  Constipation, incontinence:  GERD:  PLAN   *** Continue toileting schedule?? Metamucil *** Continue omeprazole  20 mg BID Continue GERD diet Follow up ***    Charmaine Melia, MSN, FNP-BC, AGACNP-BC [facility]

## 2024-04-16 ENCOUNTER — Ambulatory Visit: Admitting: Gastroenterology

## 2024-04-16 ENCOUNTER — Encounter: Payer: Self-pay | Admitting: Gastroenterology

## 2024-05-17 ENCOUNTER — Ambulatory Visit: Payer: Self-pay

## 2024-05-17 NOTE — Telephone Encounter (Signed)
 FYI Only or Action Required?: Action required by provider: clinical question for provider.  Patient was last seen in primary care on 02/21/2024 by Bevely Doffing, FNP.  Called Nurse Triage reporting Extremity Weakness.  Symptoms began several weeks ago.  Interventions attempted: Nothing.  Symptoms are: unchanged. Caregiver asking for PT at home for pt. Please advise.  Triage Disposition: See PCP When Office is Open (Within 3 Days)  Patient/caregiver understands and will follow disposition?: No, wishes to speak with PCP   Copied from CRM #[id number]. Topic: Clinical - Medical Advice >> May 17, 2024  1:37 PM [name] wrote: Reason for CRM: Received call from [name], patient care giver, [name] verified, states patient is receiving botox  in his legs, concerned patient is losing leg function, as patient reports he feels as though his knees are giving out on him, causing increased instability, some swelling in the past as well. Reason for Disposition  [1] Weakness of arm / hand, or leg / foot AND [2] is a chronic symptom (recurrent or ongoing AND present > 4 weeks)  Answer Assessment - Initial Assessment Questions 1. SYMPTOM: What is the main symptom you are concerned about? (e.g., weakness, numbness)     Losing leg function 2. ONSET: When did this start? (e.g., minutes, hours, days; while sleeping)     weeks 3. LAST NORMAL: When was the last time you (the patient) were normal (no symptoms)?     several 4. PATTERN Does this come and go, or has it been constant since it started?  Is it present now?     constant 5. CARDIAC SYMPTOMS: Have you had any of the following symptoms: chest pain, difficulty breathing, palpitations?     no 6. NEUROLOGIC SYMPTOMS: Have you had any of the following symptoms: headache, dizziness, vision loss, double vision, changes in speech, unsteady on your feet?     no 7. OTHER SYMPTOMS: Do you have any other symptoms?     no 8. PREGNANCY: Is there any  chance you are pregnant? When was your last menstrual period?     N/a  Protocols used: Neurologic Deficit-A-AH

## 2024-05-23 NOTE — Telephone Encounter (Signed)
 Pts caregiver on [name] is advised to contact Neruology

## 2024-05-29 ENCOUNTER — Ambulatory Visit (INDEPENDENT_AMBULATORY_CARE_PROVIDER_SITE_OTHER)

## 2024-05-29 ENCOUNTER — Ambulatory Visit: Payer: Self-pay

## 2024-05-29 VITALS — BP 117/81 | HR 84 | Ht 67.0 in | Wt 170.0 lb

## 2024-05-29 DIAGNOSIS — R1031 Right lower quadrant pain: Secondary | ICD-10-CM | POA: Diagnosis not present

## 2024-05-29 NOTE — Telephone Encounter (Signed)
 FYI Only or Action Required?: Action required by provider: request for appointment.  Patient was last seen in primary care on 02/21/2024 by Tom Doffing, FNP.  Called Nurse Triage reporting Abdominal Pain.  Symptoms began several days ago.  Interventions attempted: Nothing.  Symptoms are: unchanged.  Triage Disposition: See Physician Within 24 Hours  Patient/caregiver understands and will follow disposition?: Yes      Copied from CRM #[id number]. Topic: Clinical - Red Word Triage >> May 29, 2024  9:59 AM Willma SAUNDERS wrote: Red Word that prompted transfer to Nurse Triage: Patient is experiencing pain in his right side. Has been occurring on and off and has been to UC and to the office but haven't found the cause, but the pain is still there. Reason for Disposition  [1] MODERATE pain (e.g., interferes with normal activities) AND [2] pain comes and goes (cramps) AND [3] present > 24 hours  (Exception: Pain with Vomiting or Diarrhea - see that Guideline.)    Pain comes and goes, no n/v/d  Answer Assessment - Initial Assessment Questions Scheduled appointment with provider, advised UC/ED if worsen.  Spoke with Lauree Ober caregiver; [name], and patient.  Patient reports when they pick me up, it hurts by where ribs at, my belly hurts. Caregiver reports patient takes Miralax, fiber, last BM yesterday, no changes to appetite. Caregiver reports no distended abdomen, soft upon palpation, unable to assess if pain is worse from palpation of abdomen; patient unable to convey if worse pain.  1. LOCATION: Where does it hurt?      Right side;lower right to middle of belly 2. RADIATION: Does the pain shoot anywhere else? (e.g., chest, back)     no 3. ONSET: When did the pain begin? (Minutes, hours or days ago)      On and off for week 4. SUDDEN: Gradual or sudden onset?     gradual 5. PATTERN Does the pain come and go, or is it constant?     Comes and goes 6. SEVERITY: How bad  is the pain?  (e.g., Scale 1-10; mild, moderate, or severe)     10/10 7. RECURRENT SYMPTOM: Have you ever had this type of stomach pain before? If Yes, ask: When was the last time? and What happened that time?      Previously seen in clinic and urgent care 1 to 2 months ago, been reoccurring situation 8. CAUSE: What do you think is causing the stomach pain? (e.g., gallstones, recent abdominal surgery)     no 9. RELIEVING/AGGRAVATING FACTORS: What makes it better or worse? (e.g., antacids, bending or twisting motion, bowel movement)     miralax 10. OTHER SYMPTOMS: Do you have any other symptoms? (e.g., back pain, diarrhea, fever, urination pain, vomiting)       No n/v/d, fever, unable to assess if pain with urination from patient. Caregiver reports no foul smelling urine.  Protocols used: Abdominal Pain - Male-A-AH

## 2024-05-29 NOTE — Progress Notes (Signed)
 Established Patient Office Visit  Subjective   Patient ID: Tom Russell, male    DOB: 09/29/1971  Age: 53 y.o. MRN: [id number]  Chief Complaint  Patient presents with   Medical Management of Chronic Issues    Right side abdominal pain    HPI Discussed the use of AI scribe software for clinical note transcription with the patient, who gave verbal consent to proceed.  History of Present Illness   Tom Russell is a 53 year old male who presents with right-sided abdominal pain.  Right-sided abdominal pain - Right-sided abdominal pain present for the past two weeks - Pain occurs every other day - Described as 'hurting firmly in this area right there' - No association with eating or specific times of day - No visible discomfort in body language, but verbalizes pain - No associated back pain  Gastrointestinal function - Regular bowel movements - No changes in bowel habits reported  Genitourinary symptoms - Previous episode of burning with urination, attributed to 'scratchy stuff' - Urinary tract infection previously ruled out by urine test  Mobility status - Wheelchair-bound most of the time  Colorectal screening - Normal colonoscopy performed last year      Patient Active Problem List   Diagnosis Date Noted   Spastic quadriplegic cerebral palsy (HCC) 11/24/2023   Stroke (HCC) 02/09/2023   GERD (gastroesophageal reflux disease) 12/11/2020   Encounter for screening colonoscopy 12/11/2020   Pain in right finger(s) 03/06/2019   Nausea with vomiting 11/14/2016   Dyspepsia 11/14/2016      ROS    Objective:     BP 117/81   Pulse 84   Ht 5' 7 (1.702 m)   Wt 170 lb (77.1 kg)   SpO2 91%   BMI 26.63 kg/m  BP Readings from Last 3 Encounters:  05/29/24 117/81  04/11/24 124/81  03/12/24 (!) 136/93   Wt Readings from Last 3 Encounters:  05/29/24 170 lb (77.1 kg)  04/11/24 165 lb (74.8 kg)  03/12/24 150 lb (68 kg)     Physical Exam Vitals and nursing note  reviewed. Exam conducted with a chaperone present (group home staff member).  Constitutional:      Appearance: Normal appearance.  HENT:     Head: Normocephalic.  Eyes:     Extraocular Movements: Extraocular movements intact.     Pupils: Pupils are equal, round, and reactive to light.  Cardiovascular:     Rate and Rhythm: Normal rate and regular rhythm.  Pulmonary:     Effort: Pulmonary effort is normal.     Breath sounds: Normal breath sounds.  Abdominal:     General: Bowel sounds are normal. There is no distension.     Palpations: Abdomen is soft. There is no mass.     Tenderness: There is no abdominal tenderness. There is no right CVA tenderness, left CVA tenderness, guarding or rebound.     Hernia: No hernia is present.  Musculoskeletal:     Right lower leg: No edema.     Left lower leg: No edema.  Neurological:     Mental Status: He is alert. Mental status is at baseline.     Gait: Gait abnormal (WC bound).  Psychiatric:        Behavior: Behavior is cooperative.      No results found for any visits on 05/29/24.    The ASCVD Risk score (Arnett DK, et al., 2019) failed to calculate for the following reasons:   Risk score cannot be  calculated because patient has a medical history suggesting prior/existing ASCVD    Assessment & Plan:   Problem List Items Addressed This Visit   None Visit Diagnoses       RLQ abdominal pain    -  Primary   Relevant Orders   US  Abdomen Limited RUQ (LIVER/GB)      Assessment and Plan    Right-sided intermittent abdominal pain Intermittent pain for two weeks. Differential includes appendicitis and gallbladder issues. Appendicitis less likely due to lack of tenderness. - Order abdominal ultrasound to evaluate appendix and gallbladder. - Ensure he is fasting on the day of the ultrasound. - Advised to seek immediate care if pain worsens or becomes persistent.       No follow-ups on file.    Leita Longs, FNP

## 2024-06-03 ENCOUNTER — Ambulatory Visit (HOSPITAL_COMMUNITY)

## 2024-06-13 ENCOUNTER — Ambulatory Visit (HOSPITAL_COMMUNITY)
Admission: RE | Admit: 2024-06-13 | Discharge: 2024-06-13 | Disposition: A | Discharge status: Home / Self Care | Source: Ambulatory Visit

## 2024-06-13 DIAGNOSIS — R1031 Right lower quadrant pain: Secondary | ICD-10-CM | POA: Diagnosis not present

## 2024-06-19 ENCOUNTER — Other Ambulatory Visit: Payer: Self-pay

## 2024-06-19 ENCOUNTER — Ambulatory Visit: Payer: Self-pay

## 2024-06-19 DIAGNOSIS — R1031 Right lower quadrant pain: Secondary | ICD-10-CM

## 2024-06-19 DIAGNOSIS — K8020 Calculus of gallbladder without cholecystitis without obstruction: Secondary | ICD-10-CM

## 2024-06-19 NOTE — Progress Notes (Addendum)
 GI Office Note    Referring Provider: Bevely Doffing, FNP Primary Care Physician:  Bevely Doffing, FNP Primary Gastroenterologist: Tom Russell.Rourk, MD  Date:  06/20/2024  ID:  Tom Russell, DOB November 19, 1970, MRN [id number]   Chief Complaint   Chief Complaint  Patient presents with   abnormal imaging   History of Present Illness  Tom Russell is a 53 y.o. male with a history of CP, GERD, anxiety, depression, and seizures presenting today with complaint of abnormal imaging.   OV 04/11/2023.  Caregiver reported having too many loose stools with MiraLAX and was having more accidents.  Patient unable to tell when he has done a bowel movement.  Having some lower abdominal pain.  Has a good appetite.  MiraLAX was placed as needed given to me looser stools.  Patient reportedly talked to his sister about a colonoscopy but she did not say anything about it.  Denied any nausea or vomiting.  Doing okay in regards to swallowing.  Reflux controlled.  Advise continue omeprazole  20 mg twice daily, GERD diet.  Keep MiraLAX as needed and start Metamucil or Benefiber daily.  Flexible sigmoidoscopy scheduled..   Colonoscopy 05/24/2023: -Redundant and elongated colon -Entire examined colon norma   OV 09/12/23.  Sometimes able to go to the restroom, sometimes cannot.  Does have to strain at times.  Having pain in the lower abdomen mostly with bowel movements.  Not having as many accidents as previously.  Has a good appetite and eating well.  Denied any epigastric pain or burning, nausea, vomiting, or dysphagia. Advised to continue omeprazole  20 mg twice daily, famotidine 40 mg nightly, GERD diet.  Continue MiraLAX daily as well as starting Senokot nightly and started daily fiber supplementation once in the mornings.  Last office visit 12/13/23.  Caregiver reported some loss of sensation and going to PT and receiving Botox  injections.  Have been doing well with dietary changes and was doing well on this medication  twice daily for reflux.  Doing well without any upper or lower GI symptoms.  Advise increasing Metamucil to 1 tablespoon twice daily and continue MiraLAX once daily and stop senna and continue toileting schedule.  Continue omeprazole  20 mg twice daily.  Follow-up 4 months.  Visit with PCP 05/29/24 for RLQ pain. Present for 2 weeks. Regular Bms. Abdominal US  ordered.   RUQ US  06/13/24: - Cholelithiasis and distended gallbladder - no overt cholecystitis - Increased liver echotexture with expansion in left liver - possible lobular appearance (could be nonspecific, correlate for cirrhosis) - Radiology recommended consideration of HIDA if ongoing pain.  Today:  Discussed the use of AI scribe software for clinical note transcription with the patient, who gave verbal consent to proceed.  He has been experiencing abdominal pain for the past three weeks, more in the bottom of his abdomen. The pain occurs as soon as he gets up in the morning. He has a history of gallstones. No recent changes in diet, and he usually consumes baked foods, occasionally eating out once a week. He does not consume a lot of fried or greasy foods.  He has also been experiencing constipation, although the exact duration is unclear. He is currently taking Benefiber and Clearlax, but there is uncertainty about the regularity of his medication intake. He has not been using any stool softeners recently. No blood in his stool, but occasionally notices green stool, which he attributes to dietary intake.  In terms of his general health, he reports a good appetite and  denies any nausea or vomiting. He has gained weight since his last visit, indicating adequate nutritional intake. No chest pain or shortness of breath. He reports feeling more tired than usual, particularly in the mornings, but states that he sleeps well. He is mostly in a wheelchair and does not walk much at his facility.  No yellowing of the skin or changes in stool color have  been noticed.  Per review of medication list, he is not taking any reflux medication.  Was previously taking this twice daily omeprazole  20 mg  Wt Readings from Last 5 Encounters:  05/29/24 170 lb (77.1 kg)  04/11/24 165 lb (74.8 kg)  03/12/24 150 lb (68 kg)  02/21/24 150 lb (68 kg)  02/05/24 150 lb (68 kg)    Current Outpatient Medications  Medication Sig Dispense Refill   ARIPiprazole (ABILIFY) 5 MG tablet Take 5 mg by mouth every morning.     clotrimazole (LOTRIMIN) 1 % cream Apply 1 application  topically daily at 6 (six) AM.     gabapentin (NEURONTIN) 800 MG tablet Take 800 mg by mouth in the morning, at noon, in the evening, and at bedtime. (0800, 1200, 1600 & 2000)     GOODSENSE CLEARLAX 17 GM/SCOOP powder Take 17 g by mouth in the morning.     hydrochlorothiazide (MICROZIDE) 12.5 MG capsule Take 12.5 mg by mouth daily.     Incontinence Supply Disposable (PREVAIL BOXERS FOR MEN S-M) MISC by Miscellaneous route.     nystatin-triamcinolone (MYCOLOG II) cream Apply 1 Application topically 3 (three) times daily.     TERBINAFINE EX Apply topically. BID fungus on toenails     Wheat Dextrin (BENEFIBER) POWD Mix 1 tablespoon in 8 ounces of water  and give twice daily. 245 g 1   zolpidem (AMBIEN) 5 MG tablet Take 5 mg by mouth at bedtime.     No current facility-administered medications for this visit.    Past Medical History:  Diagnosis Date   Anxiety    Cerebral palsy (HCC)    Depression    GERD (gastroesophageal reflux disease)    Mental retardation    Seizures (HCC)     Past Surgical History:  Procedure Laterality Date   COLONOSCOPY WITH PROPOFOL  N/A 05/24/2023   Procedure: COLONOSCOPY WITH PROPOFOL ;  Surgeon: Tom Tom HERO, MD;  Location: [facility];  Service: Endoscopy;  Laterality: N/A;  930am, asa 3, group home patient   ESOPHAGOGASTRODUODENOSCOPY (EGD) WITH PROPOFOL  N/A 11/23/2017   normal esophagus, single 4 mm semi-sessile polyp in gastric fundus, duodenal bulb  normal, s/p biopsy. Fundic polyp, negative H.pylori.    None to date     as of 11/14/16    Family History  Family history unknown: Yes    Allergies as of 06/20/2024   (No Known Allergies)    Social History   Socioeconomic History   Marital status: Single    Spouse name: Not on file   Number of children: Not on file   Years of education: Not on file   Highest education level: Not on file  Occupational History   Not on file  Tobacco Use   Smoking status: Never   Smokeless tobacco: Never  Vaping Use   Vaping status: Never Used  Substance and Sexual Activity   Alcohol  use: No   Drug use: No   Sexual activity: Never    Birth control/protection: None  Other Topics Concern   Not on file  Social History Narrative   [facility]  house 2       Right hand       Social Drivers of Health   Financial Resource Strain: Not on file  Food Insecurity: No Food Insecurity (02/05/2024)   Hunger Vital Sign    Worried About Running Out of Food in the Last Year: Never true    Ran Out of Food in the Last Year: Never true  Transportation Needs: No Transportation Needs (02/05/2024)   PRAPARE - Administrator, Civil Service (Medical): No    Lack of Transportation (Non-Medical): No  Physical Activity: Not on file  Stress: Not on file  Social Connections: Not on file     Review of Systems   Gen: Denies fever, chills, anorexia. Denies fatigue, weakness, weight loss.  CV: Denies chest pain, palpitations, syncope, peripheral edema, and claudication. Resp: Denies dyspnea at rest, cough, wheezing, coughing up blood, and pleurisy. GI: See HPI Derm: Denies rash, itching, dry skin Psych: Denies depression, anxiety, memory loss, confusion. No homicidal or suicidal ideation.  Heme: Denies bruising, bleeding, and enlarged lymph nodes.  Physical Exam   BP 124/83 (BP Location: Left Arm, Patient Position: Sitting, Cuff Size: Normal)   Pulse 71   Temp (!) 97.3 F (36.3 C) (Oral)    SpO2 93%   General:   Alert and oriented. No distress noted. Pleasant and cooperative.  Head:  Normocephalic and atraumatic. Eyes:  Conjuctiva clear without scleral icterus. Incongruent.  Abdomen:  +BS, soft, non-distended, rounded. Ttp to RLQ and mid lower abdomen. No rebound or guarding. No HSM or masses noted. Rectal: deferred Msk:  Normal posture. Wheelchair bound.  Extremities:  Without edema. Neurologic:  Alert and  oriented x4 Psych:  Alert and cooperative. Normal mood and affect.  Assessment  Tom Russell is a 52 y.o. male presenting today for evaluation of abnormal abdominal imaging and right upper quadrant pain.    Abnormal US  of abdomen with concern for cirrhosis Mildly lobular contour and evidence of cholelithiasis on US . He reports fatigue and some abdominal tenderness, without jaundice, significant weight loss, or appetite changes. No evidence of ascites.  - Order CT scan of the abdomen and pelvis with contrast to assess liver condition - Perform blood work to evaluate liver function - CBC, CMP, INR, ANA, ASMA, AMA, ferritin, elf, FibroSure, acute hepatitis panel  Gallstones (cholelithiasis) with RUQ and intermittent nausea Gallstones causing intermittent right upper quadrant pain, potentially exacerbated by dietary factors such as fatty or greasy foods. He consumes snacks and occasional red meat, which may contribute to symptoms. Bo overt cholecystitis on imaging - Consider further evaluation with HIDA scan if symptoms persist after CT results  Constipation with lower abdominal pain Constipation contributing to lower abdominal pain. Current regimen includes Miralax once daily, but symptoms persist. No blood or black stools reported. - Increase Miralax to 17g twice daily to alleviate constipation  GERD GERD with some upper abdominal tenderness. Currently not on omeprazole . No current nausea or vomiting reported. - Consider resuming omeprazole  20 mg twice daily if  upper abdominal pain or nausea persists at next visit.     PLAN    Follow up 8 weeks.     Charmaine Melia, MSN, FNP-BC, AGACNP-BC [facility]

## 2024-06-20 ENCOUNTER — Encounter: Payer: Self-pay | Admitting: Gastroenterology

## 2024-06-20 ENCOUNTER — Ambulatory Visit (INDEPENDENT_AMBULATORY_CARE_PROVIDER_SITE_OTHER): Admitting: Gastroenterology

## 2024-06-20 VITALS — BP 124/83 | HR 71 | Temp 97.3°F

## 2024-06-20 DIAGNOSIS — K8020 Calculus of gallbladder without cholecystitis without obstruction: Secondary | ICD-10-CM

## 2024-06-20 DIAGNOSIS — K5900 Constipation, unspecified: Secondary | ICD-10-CM | POA: Diagnosis not present

## 2024-06-20 DIAGNOSIS — R1030 Lower abdominal pain, unspecified: Secondary | ICD-10-CM

## 2024-06-20 DIAGNOSIS — R1011 Right upper quadrant pain: Secondary | ICD-10-CM

## 2024-06-20 DIAGNOSIS — R935 Abnormal findings on diagnostic imaging of other abdominal regions, including retroperitoneum: Secondary | ICD-10-CM

## 2024-06-20 DIAGNOSIS — K219 Gastro-esophageal reflux disease without esophagitis: Secondary | ICD-10-CM

## 2024-06-20 MED ORDER — GOODSENSE CLEARLAX 17 GM/SCOOP PO POWD: 17.0000 g | Freq: Two times a day (BID) | ORAL | 3 refills | Status: AC

## 2024-06-20 NOTE — Patient Instructions (Addendum)
 Recent ultrasound of your abdomen revealed gallstones and a distended gallbladder without any acute inflammation.  There is also some possible lobular appearance to the liver also with some increased echotexture which could be fatty liver.  We will start with a evaluation with a CT scan of the abdomen to follow-up send pending those results may consider HIDA scan in the future.  Increase MiraLAX to 17 g twice daily to help with constipation. Prescription updated.  Continue Benefiber powder daily.  Follow a GERD diet:  Avoid fried, fatty, greasy, spicy, citrus foods. Avoid caffeine and carbonated beverages. Avoid chocolate. Try eating 4-6 small meals a day rather than 3 large meals. Do not eat within 3 hours of laying down. Prop head of bed up on wood or bricks to create a 6 inch incline.  Follow-up in 8 weeks.  It was a pleasure to see you today. I want to create trusting relationships with patients. If you receive a survey regarding your visit,  I greatly appreciate you taking time to fill this out on paper or through your MyChart. I value your feedback.  Charmaine Melia, MSN, FNP-BC, AGACNP-BC [facility]

## 2024-06-25 DIAGNOSIS — M79672 Pain in left foot: Secondary | ICD-10-CM | POA: Diagnosis not present

## 2024-06-25 DIAGNOSIS — M79671 Pain in right foot: Secondary | ICD-10-CM | POA: Diagnosis not present

## 2024-06-25 DIAGNOSIS — M79674 Pain in right toe(s): Secondary | ICD-10-CM | POA: Diagnosis not present

## 2024-06-25 DIAGNOSIS — M79675 Pain in left toe(s): Secondary | ICD-10-CM | POA: Diagnosis not present

## 2024-06-25 DIAGNOSIS — L110 Acquired keratosis follicularis: Secondary | ICD-10-CM | POA: Diagnosis not present

## 2024-06-25 DIAGNOSIS — I739 Peripheral vascular disease, unspecified: Secondary | ICD-10-CM | POA: Diagnosis not present

## 2024-06-26 ENCOUNTER — Telehealth: Payer: Self-pay | Admitting: *Deleted

## 2024-06-26 NOTE — Telephone Encounter (Signed)
 Informed caregiver Lauree Ober of CT appointment date, time and location

## 2024-06-26 NOTE — Telephone Encounter (Signed)
 LMOVM to return call   CT scheduled for Friday 07/19/24 at [facility]. Pt needs to arrive at 1:15 pm to check in.

## 2024-06-26 NOTE — Telephone Encounter (Signed)
 [employer] PA for CT: Authorization Number: [id number] Case Number: [id number] Review Date: 06/26/2024 9:30:15 AM Expiration Date: 07/26/2024 Status: Your case has been Approved. The prior authorization you submitted, Case [id number], has been received. Additional case status notifications will be sent if you opted in for email notifications. Thank you.  [employer] PA for CT: CPT Code 25822 Description: CT ABDOMEN & PELVIS W/ Case Number: [id number] Review Date: 06/26/2024 9:34:17 AM Expiration Date: N/A Status: This member's benefit plan did not require a prior authorization for this request.

## 2024-06-28 ENCOUNTER — Telehealth: Payer: Self-pay

## 2024-06-28 ENCOUNTER — Ambulatory Visit: Payer: Self-pay

## 2024-06-28 NOTE — Telephone Encounter (Signed)
 Copied from CRM [id number]. Topic: Clinical - Prescription Issue >> Jun 28, 2024 11:06 AM Jakyia R wrote: Requesting prescription for depends ( please do NOT include a spacific brand) and gloves. Will be processed through his insurance. Please call Lauree Ober- [phone]

## 2024-07-02 ENCOUNTER — Telehealth: Payer: Self-pay

## 2024-07-02 ENCOUNTER — Ambulatory Visit (INDEPENDENT_AMBULATORY_CARE_PROVIDER_SITE_OTHER): Payer: Self-pay

## 2024-07-02 DIAGNOSIS — Z23 Encounter for immunization: Secondary | ICD-10-CM | POA: Diagnosis not present

## 2024-07-02 NOTE — Telephone Encounter (Signed)
 Copied from CRM #[id number]. Topic: Clinical - Prescription Issue >> Jul 02, 2024  1:11 PM DeAngela L wrote: Reason for CRM: Lauree DEL is call for cause patient is paying out of pocket $180 for depends  and he is also buying gloves and needs insurance approval for these items so he doesn't pay out of pocket  This is the case workers number who help with get the prescription needed for the depends  Ms Echo Jori  [phone] [facility] case worker   Lauree DEL for any additional question [phone]

## 2024-07-03 ENCOUNTER — Other Ambulatory Visit: Payer: Self-pay

## 2024-07-03 DIAGNOSIS — R32 Unspecified urinary incontinence: Secondary | ICD-10-CM

## 2024-07-03 MED ORDER — UNABLE TO FIND
5 refills | Status: AC
Start: 2024-07-03 — End: ?

## 2024-07-03 NOTE — Telephone Encounter (Signed)
 Supplies ordered. LVM for case worker to call back, need to know where to send orders

## 2024-07-03 NOTE — Telephone Encounter (Signed)
 faxed

## 2024-07-03 NOTE — Telephone Encounter (Signed)
 Case worker returned call send medical supplies to:  [facility] [address] [place] [address]  Phone #  [phone] Fax # [phone]  Name: Tom Russell [email or web address]   Doing the incontinence supplies and gloves.any question call [phone].

## 2024-07-12 ENCOUNTER — Encounter: Payer: Self-pay | Admitting: Physical Medicine & Rehabilitation

## 2024-07-12 ENCOUNTER — Encounter: Attending: Physical Medicine & Rehabilitation | Admitting: Physical Medicine & Rehabilitation

## 2024-07-12 VITALS — BP 127/81 | HR 77 | Ht 67.0 in | Wt 170.0 lb

## 2024-07-12 DIAGNOSIS — G800 Spastic quadriplegic cerebral palsy: Secondary | ICD-10-CM | POA: Insufficient documentation

## 2024-07-12 MED ORDER — ONABOTULINUMTOXINA 100 UNITS IJ SOLR
400.0000 [IU] | Freq: Once | INTRAMUSCULAR | Status: AC
  Administered 2024-07-12: 400 [IU] via INTRAMUSCULAR

## 2024-07-12 MED ORDER — SODIUM CHLORIDE (PF) 0.9 % IJ SOLN
8.0000 mL | Freq: Once | INTRAMUSCULAR | Status: AC
  Administered 2024-07-12: 8 mL

## 2024-07-12 NOTE — Progress Notes (Signed)
 Botox Injection for spasticity using needle EMG guidance  Dilution: 50 Units/ml Indication: Severe spasticity which interferes with ADL,mobility and/or  hygiene and is unresponsive to medication management and other conservative care Informed consent was obtained after describing risks and benefits of the procedure with the patient. This includes bleeding, bruising, infection, excessive weakness, or medication side effects. A REMS form is on file and signed. Needle: 50mm 25g needle electrode Number of units per muscle Right  Hamstrings200 Left  Hamstrings 200 All injections were done after obtaining appropriate EMG activity and after negative drawback for blood. The patient tolerated the procedure well. Post procedure instructions were given. A followup appointment was made.

## 2024-07-19 ENCOUNTER — Ambulatory Visit (HOSPITAL_COMMUNITY)
Admission: RE | Admit: 2024-07-19 | Discharge: 2024-07-19 | Disposition: A | Discharge status: Home / Self Care | Source: Ambulatory Visit | Attending: Gastroenterology | Admitting: Gastroenterology

## 2024-07-19 DIAGNOSIS — K8020 Calculus of gallbladder without cholecystitis without obstruction: Secondary | ICD-10-CM | POA: Diagnosis not present

## 2024-07-19 DIAGNOSIS — K7460 Unspecified cirrhosis of liver: Secondary | ICD-10-CM | POA: Diagnosis not present

## 2024-07-19 DIAGNOSIS — K766 Portal hypertension: Secondary | ICD-10-CM | POA: Diagnosis not present

## 2024-07-19 DIAGNOSIS — R590 Localized enlarged lymph nodes: Secondary | ICD-10-CM | POA: Diagnosis not present

## 2024-07-19 DIAGNOSIS — R935 Abnormal findings on diagnostic imaging of other abdominal regions, including retroperitoneum: Secondary | ICD-10-CM | POA: Diagnosis not present

## 2024-07-19 DIAGNOSIS — R1011 Right upper quadrant pain: Secondary | ICD-10-CM | POA: Insufficient documentation

## 2024-07-19 MED ORDER — IOHEXOL 300 MG/ML  SOLN
100.0000 mL | Freq: Once | INTRAMUSCULAR | Status: AC | PRN
  Administered 2024-07-19: 100 mL via INTRAVENOUS

## 2024-07-25 ENCOUNTER — Ambulatory Visit

## 2024-07-25 VITALS — BP 130/79 | HR 89 | Ht 67.0 in | Wt 168.0 lb

## 2024-07-25 DIAGNOSIS — Z0000 Encounter for general adult medical examination without abnormal findings: Secondary | ICD-10-CM | POA: Diagnosis not present

## 2024-07-25 NOTE — Patient Instructions (Signed)
 Tom Russell,  Thank you for taking the time for your Medicare Wellness Visit. I appreciate your continued commitment to your health goals. Please review the care plan we discussed, and feel free to reach out if I can assist you further.  Medicare recommends these wellness visits once per year to help you and your care team stay ahead of potential health issues. These visits are designed to focus on prevention, allowing your provider to concentrate on managing your acute and chronic conditions during your regular appointments.  Please note that Annual Wellness Visits do not include a physical exam. Some assessments may be limited, especially if the visit was conducted virtually. If needed, we may recommend a separate in-person follow-up with your provider.  Wishing you excellent health and many blessings in the year to come!  -Thaila Bottoms, CMA  Ongoing Care Seeing your primary care provider every 3 to 6 months helps us  monitor your health and provide consistent, personalized care.   Recommended Screenings: The items listed below are your health recommendations.   Health Maintenance  Topic Date Due   COVID-19 Vaccine (1) Never done   HIV Screening  Never done   Hepatitis C Screening  Never done   Hepatitis B Vaccine (1 of 3 - 19+ 3-dose series) Never done   Zoster (Shingles) Vaccine (1 of 2) Never done   DTaP/Tdap/Td vaccine (2 - Td or Tdap) 03/04/2024   Medicare Annual Wellness Visit  07/25/2025   Colon Cancer Screening  05/23/2033   Pneumococcal Vaccine for age over 64  Completed   Flu Shot  Completed   HPV Vaccine  Aged Out   Meningitis B Vaccine  Aged Out       07/25/2024    4:01 PM  Advanced Directives  Does Patient Have a Medical Advance Directive? No  Would patient like information on creating a medical advance directive? No - Patient declined   Advance Care Planning is important because it: Ensures you receive medical care that aligns with your values, goals, and  preferences. Provides guidance to your family and loved ones, reducing the emotional burden of decision-making during critical moments.  Vision: Annual vision screenings are recommended for early detection of glaucoma, cataracts, and diabetic retinopathy. These exams can also reveal signs of chronic conditions such as diabetes and high blood pressure.  Dental: Annual dental screenings help detect early signs of oral cancer, gum disease, and other conditions linked to overall health, including heart disease and diabetes.  Please see the attached documents for additional preventive care recommendations.

## 2024-07-25 NOTE — Progress Notes (Signed)
 Subjective:   Tom Russell is a 53 y.o. who presents for a Medicare Wellness preventive visit.  As a reminder, Annual Wellness Visits don't include a physical exam, and some assessments may be limited, especially if this visit is performed virtually. We may recommend an in-person follow-up visit with your provider if needed.  Visit Complete: Virtual I connected with  Tom Russell on 07/25/24 by a audio enabled telemedicine application and verified that I am speaking with the correct person using two identifiers.  Patient Location: Skilled Nursing Facility  Provider Location: Home Office  I discussed the limitations of evaluation and management by telemedicine. The patient expressed understanding and agreed to proceed.  Vital Signs: Because this visit was a virtual/telehealth visit, some criteria may be missing or patient reported. Any vitals not documented were not able to be obtained and vitals that have been documented are patient reported.  VideoDeclined- This patient declined Librarian, academic. Therefore the visit was completed with audio only.  Persons Participating in Visit: Patient assisted by Ayanna.  AWV Questionnaire: No: Patient Medicare AWV questionnaire was not completed prior to this visit.  Cardiac Risk Factors include: male gender;Other (see comment), Risk factor comments: history of stroke     Objective:    Today's Vitals   07/25/24 1244  BP: 130/79  Pulse: 89  Weight: 168 lb (76.2 kg)  Height: 5' 7 (1.702 m)  PainSc: 0-No pain   Body mass index is 26.31 kg/m.     07/25/2024    4:01 PM 01/02/2024    9:51 AM 06/08/2023    9:28 AM 10/16/2017   10:05 AM  Advanced Directives  Does Patient Have a Medical Advance Directive? No No No Yes   Type of Advance Directive    Healthcare Power of Attorney  Does patient want to make changes to medical advance directive?    No - Patient declined   Copy of Healthcare Power of Attorney in  Chart?    No - copy requested   Would patient like information on creating a medical advance directive? No - Patient declined        Data saved with a previous flowsheet row definition    Current Medications (verified) Outpatient Encounter Medications as of 07/25/2024  Medication Sig   ARIPiprazole (ABILIFY) 5 MG tablet Take 5 mg by mouth every morning.   clotrimazole (LOTRIMIN) 1 % cream Apply 1 application  topically daily at 6 (six) AM.   gabapentin (NEURONTIN) 800 MG tablet Take 800 mg by mouth in the morning, at noon, in the evening, and at bedtime. (0800, 1200, 1600 & 2000)   GOODSENSE CLEARLAX 17 GM/SCOOP powder Take 17 g by mouth in the morning and at bedtime.   hydrochlorothiazide (MICROZIDE) 12.5 MG capsule Take 12.5 mg by mouth daily.   Incontinence Supply Disposable (PREVAIL BOXERS FOR MEN S-M) MISC by Miscellaneous route.   nystatin-triamcinolone (MYCOLOG II) cream Apply 1 Application topically 3 (three) times daily.   TERBINAFINE EX Apply topically. BID fungus on toenails   UNABLE TO FIND Med Name: Incontinence Disposable Diapers Mens Large   UNABLE TO FIND Med Name: Medium Gloves   UNABLE TO FIND Med Name: Large Gloves   Wheat Dextrin (BENEFIBER) POWD Mix 1 tablespoon in 8 ounces of water  and give twice daily.   zolpidem (AMBIEN) 5 MG tablet Take 5 mg by mouth at bedtime.   No facility-administered encounter medications on file as of 07/25/2024.    Allergies (verified) Patient  has no known allergies.   History: Past Medical History:  Diagnosis Date   Anxiety    Cerebral palsy (HCC)    Depression    GERD (gastroesophageal reflux disease)    Mental retardation    Seizures (HCC)    Past Surgical History:  Procedure Laterality Date   COLONOSCOPY WITH PROPOFOL  N/A 05/24/2023   Procedure: COLONOSCOPY WITH PROPOFOL ;  Surgeon: Shaaron Lamar HERO, MD;  Location: [facility];  Service: Endoscopy;  Laterality: N/A;  930am, asa 3, group home patient    ESOPHAGOGASTRODUODENOSCOPY (EGD) WITH PROPOFOL  N/A 11/23/2017   normal esophagus, single 4 mm semi-sessile polyp in gastric fundus, duodenal bulb normal, s/p biopsy. Fundic polyp, negative H.pylori.    None to date     as of 11/14/16   Family History  Family history unknown: Yes   Social History   Socioeconomic History   Marital status: Single    Spouse name: Not on file   Number of children: Not on file   Years of education: Not on file   Highest education level: Not on file  Occupational History   Not on file  Tobacco Use   Smoking status: Never   Smokeless tobacco: Never  Vaping Use   Vaping status: Never Used  Substance and Sexual Activity   Alcohol  use: No   Drug use: No   Sexual activity: Never    Birth control/protection: None  Other Topics Concern   Not on file  Social History Narrative   [facility]       Right hand       Social Drivers of Health   Financial Resource Strain: Low Risk  (07/25/2024)   Overall Financial Resource Strain (CARDIA)    Difficulty of Paying Living Expenses: Not hard at all  Food Insecurity: No Food Insecurity (07/25/2024)   Hunger Vital Sign    Worried About Running Out of Food in the Last Year: Never true    Ran Out of Food in the Last Year: Never true  Transportation Needs: No Transportation Needs (07/25/2024)   PRAPARE - Administrator, Civil Service (Medical): No    Lack of Transportation (Non-Medical): No  Physical Activity: Inactive (07/25/2024)   Exercise Vital Sign    Days of Exercise per Week: 0 days    Minutes of Exercise per Session: 0 min  Stress: No Stress Concern Present (07/25/2024)   [employer] of Occupational Health - Occupational Stress Questionnaire    Feeling of Stress: Not at all  Social Connections: Unknown (07/25/2024)   Social Connection and Isolation Panel    Frequency of Communication with Friends and Family: More than three times a week    Frequency of Social Gatherings  with Friends and Family: More than three times a week    Attends Religious Services: More than 4 times per year    Active Member of [employer] or Organizations: No    Attends Engineer, structural: Never    Marital Status: Patient declined    Tobacco Counseling Counseling given: Yes    Clinical Intake:  Pre-visit preparation completed: Yes  Pain : No/denies pain Pain Score: 0-No pain     BMI - recorded: 26.31 Nutritional Status: BMI 25 -29 Overweight Nutritional Risks: None Diabetes: No  No results found for: HGBA1C   How often do you need to have someone help you when you read instructions, pamphlets, or other written materials from your doctor or pharmacy?: 1 - Never  Interpreter Needed?: No  Information entered by :: Shemuel Harkleroad W CMA (AAMA)   Activities of Daily Living     07/25/2024    3:56 PM  In your present state of health, do you have any difficulty performing the following activities:  Hearing? 0  Vision? 0  Difficulty concentrating or making decisions? 0  Walking or climbing stairs? 1  Dressing or bathing? 1  Doing errands, shopping? 1  Preparing Food and eating ? Y  Comment all yes answers-pt is wheelchair bound. lives in assisted living facility  Using the Toilet? Y  In the past six months, have you accidently leaked urine? Y  Do you have problems with loss of bowel control? Y  Managing your Medications? Y  Managing your Finances? Y  Housekeeping or managing your Housekeeping? Y    Patient Care Team: Bevely Doffing, FNP as PCP - General (Family Medicine) Shaaron Lamar HERO, MD as Consulting Physician (Gastroenterology) Georjean Darice HERO, MD as Consulting Physician (Neurology)  I have updated your Care Teams any recent Medical Services you may have received from other providers in the past year.     Assessment:   This is a routine wellness examination for Kadan.  Hearing/Vision screen Hearing Screening - Comments:: Patient denies any hearing  difficulties.   Vision Screening - Comments:: Wears rx glasses - up to date with routine eye exams with  [facility] in [place]   Goals Addressed               This Visit's Progress     stay active and healthy (pt-stated)          Depression Screen     07/25/2024    4:02 PM 03/12/2024   12:41 PM 02/05/2024    9:38 AM 12/29/2023   10:49 AM  PHQ 2/9 Scores  PHQ - 2 Score 0 0 0 0  PHQ- 9 Score 0  2      Fall Risk     07/25/2024    1:12 PM 04/11/2024   10:03 AM 03/12/2024   12:41 PM 02/05/2024    9:37 AM 01/02/2024    9:51 AM  Fall Risk   Falls in the past year? 0 0 0 1 0  Number falls in past yr: 0   0 0  Injury with Fall? 0   0 0  Risk for fall due to : Impaired balance/gait;Impaired mobility   History of fall(s);Impaired balance/gait   Follow up Falls prevention discussed;Education provided;Falls evaluation completed   Falls evaluation completed Falls evaluation completed    MEDICARE RISK AT HOME:  Medicare Risk at Home Any stairs in or around the home?: No If so, are there any without handrails?: No Home free of loose throw rugs in walkways, pet beds, electrical cords, etc?: Yes Adequate lighting in your home to reduce risk of falls?: Yes Life alert?: No Use of a cane, walker or w/c?: Yes Grab bars in the bathroom?: Yes Shower chair or bench in shower?: Yes Elevated toilet seat or a handicapped toilet?: Yes  TIMED UP AND GO:  Was the test performed?  No  Cognitive Function: 6CIT completed        07/25/2024    4:01 PM  6CIT Screen  What Year? 0 points  What month? 0 points  What time? 0 points  Count back from 20 0 points  Months in reverse 0 points  Repeat phrase 0 points  Total Score 0 points    Immunizations Immunization History  Administered Date(s) Administered   Influenza, Seasonal, Injecte, Preservative Fre 07/02/2024   Tdap 03/04/2014    Screening Tests Health Maintenance  Topic Date Due   COVID-19 Vaccine (1) Never done   HIV  Screening  Never done   Hepatitis C Screening  Never done   Hepatitis B Vaccines 19-59 Average Risk (1 of 3 - 19+ 3-dose series) Never done   Zoster Vaccines- Shingrix (1 of 2) Never done   DTaP/Tdap/Td (2 - Td or Tdap) 03/04/2024   Medicare Annual Wellness (AWV)  07/25/2025   Colonoscopy  05/23/2033   Pneumococcal Vaccine: 50+ Years  Completed   Influenza Vaccine  Completed   HPV VACCINES  Aged Out   Meningococcal B Vaccine  Aged Out    Health Maintenance Health Maintenance Due  Topic Date Due   COVID-19 Vaccine (1) Never done   HIV Screening  Never done   Hepatitis C Screening  Never done   Hepatitis B Vaccines 19-59 Average Risk (1 of 3 - 19+ 3-dose series) Never done   Zoster Vaccines- Shingrix (1 of 2) Never done   DTaP/Tdap/Td (2 - Td or Tdap) 03/04/2024   Health Maintenance Items Addressed: Caregiver aware of recommendations. Info mailed to facility  Additional Screening:  Vision Screening: Recommended annual ophthalmology exams for early detection of glaucoma and other disorders of the eye. Would you like a referral to an eye doctor? No    Dental Screening: Recommended annual dental exams for proper oral hygiene  Community Resource Referral / Chronic Care Management: CRR required this visit?  No   CCM required this visit?  No   Plan:    I have personally reviewed and noted the following in the patient's chart:   Medical and social history Use of alcohol , tobacco or illicit drugs  Current medications and supplements including opioid prescriptions. Patient is not currently taking opioid prescriptions. Functional ability and status Nutritional status Physical activity Advanced directives List of other physicians Hospitalizations, surgeries, and ER visits in previous 12 months Vitals Screenings to include cognitive, depression, and falls Referrals and appointments  In addition, I have reviewed and discussed with patient certain preventive protocols,  quality metrics, and best practice recommendations. A written personalized care plan for preventive services as well as general preventive health recommendations were provided to patient.   Maybel Dambrosio, CMA   07/25/2024   After Visit Summary: (Mail) Due to this being a telephonic visit, the after visit summary with patients personalized plan was offered to patient via mail   Notes: Nothing significant to report at this time.

## 2024-07-26 ENCOUNTER — Ambulatory Visit: Payer: Self-pay | Admitting: Gastroenterology

## 2024-07-26 DIAGNOSIS — R935 Abnormal findings on diagnostic imaging of other abdominal regions, including retroperitoneum: Secondary | ICD-10-CM

## 2024-07-26 DIAGNOSIS — K7460 Unspecified cirrhosis of liver: Secondary | ICD-10-CM

## 2024-07-29 ENCOUNTER — Ambulatory Visit: Admitting: Gastroenterology

## 2024-07-31 ENCOUNTER — Telehealth: Payer: Self-pay | Admitting: Gastroenterology

## 2024-07-31 NOTE — Telephone Encounter (Addendum)
 Still have not received labs from facility.  CT scan performed but needs a labs, please asked them if they can take him to have them completed or have drawn at facility and fax over results.

## 2024-07-31 NOTE — Telephone Encounter (Signed)
 Spoke to caregiver informed her of recommendations. She voiced understanding. She states she did not know about labs. I printed off lab requisitions. There were left at front desk.

## 2024-08-01 ENCOUNTER — Ambulatory Visit: Payer: Self-pay | Admitting: Gastroenterology

## 2024-08-01 ENCOUNTER — Other Ambulatory Visit (HOSPITAL_COMMUNITY)
Admission: RE | Admit: 2024-08-01 | Discharge: 2024-08-01 | Disposition: A | Discharge status: Home / Self Care | Source: Ambulatory Visit | Attending: Gastroenterology | Admitting: Gastroenterology

## 2024-08-01 DIAGNOSIS — R7989 Other specified abnormal findings of blood chemistry: Secondary | ICD-10-CM

## 2024-08-01 DIAGNOSIS — R1011 Right upper quadrant pain: Secondary | ICD-10-CM | POA: Insufficient documentation

## 2024-08-01 DIAGNOSIS — R935 Abnormal findings on diagnostic imaging of other abdominal regions, including retroperitoneum: Secondary | ICD-10-CM | POA: Insufficient documentation

## 2024-08-01 DIAGNOSIS — K8020 Calculus of gallbladder without cholecystitis without obstruction: Secondary | ICD-10-CM | POA: Diagnosis not present

## 2024-08-01 DIAGNOSIS — K7460 Unspecified cirrhosis of liver: Secondary | ICD-10-CM

## 2024-08-01 LAB — CBC
HCT: 52.3 % — ABNORMAL HIGH (ref 39.0–52.0)
Hemoglobin: 18.9 g/dL — ABNORMAL HIGH (ref 13.0–17.0)
MCH: 34.9 pg — ABNORMAL HIGH (ref 26.0–34.0)
MCHC: 36.1 g/dL — ABNORMAL HIGH (ref 30.0–36.0)
MCV: 96.7 fL (ref 80.0–100.0)
Platelets: 82 K/uL — ABNORMAL LOW (ref 150–400)
RBC: 5.41 MIL/uL (ref 4.22–5.81)
RDW: 13 % (ref 11.5–15.5)
WBC: 3.8 K/uL — ABNORMAL LOW (ref 4.0–10.5)
nRBC: 0 % (ref 0.0–0.2)

## 2024-08-01 LAB — COMPREHENSIVE METABOLIC PANEL WITH GFR
ALT: 45 U/L — ABNORMAL HIGH (ref 0–44)
AST: 55 U/L — ABNORMAL HIGH (ref 15–41)
Albumin: 4.2 g/dL (ref 3.5–5.0)
Alkaline Phosphatase: 92 U/L (ref 38–126)
Anion gap: 10 (ref 5–15)
BUN: 11 mg/dL (ref 6–20)
CO2: 34 mmol/L — ABNORMAL HIGH (ref 22–32)
Calcium: 9.4 mg/dL (ref 8.9–10.3)
Chloride: 100 mmol/L (ref 98–111)
Creatinine, Ser: 0.65 mg/dL (ref 0.61–1.24)
GFR, Estimated: >60 mL/min (ref >60)
Glucose, Bld: 100 mg/dL — ABNORMAL HIGH (ref 70–99)
Potassium: 3.6 mmol/L (ref 3.5–5.1)
Sodium: 144 mmol/L (ref 135–145)
Total Bilirubin: 1 mg/dL (ref 0.0–1.2)
Total Protein: 7 g/dL (ref 6.5–8.1)

## 2024-08-01 LAB — FERRITIN: Ferritin: 2896 ng/mL — ABNORMAL HIGH (ref 24–336)

## 2024-08-01 LAB — PROTIME-INR
INR: 1.1 (ref 0.8–1.2)
Prothrombin Time: 14.9 s (ref 11.4–15.2)

## 2024-08-02 LAB — ENA+DNA/DS+ANTICH+CENTRO+JO...
Anti JO-1: 0.3 AI (ref 0.0–0.9)
Centromere Ab Screen: <0.2 AI (ref 0.0–0.9)
Chromatin Ab SerPl-aCnc: <0.2 AI (ref 0.0–0.9)
ENA SM Ab Ser-aCnc: 1.4 AI — ABNORMAL HIGH (ref 0.0–0.9)
Ribonucleic Protein: 0.4 AI (ref 0.0–0.9)
SSA (Ro) (ENA) Antibody, IgG: 0.4 AI (ref 0.0–0.9)
SSB (La) (ENA) Antibody, IgG: <0.2 AI (ref 0.0–0.9)
Scleroderma (Scl-70) (ENA) Antibody, IgG: <0.2 AI (ref 0.0–0.9)
ds DNA Ab: 3 [IU]/mL (ref 0–9)

## 2024-08-02 LAB — MISC LABCORP TEST (SEND OUT): Labcorp test code: 550659

## 2024-08-02 LAB — ANA W/REFLEX IF POSITIVE: Anti Nuclear Antibody (ANA): POSITIVE — AB

## 2024-08-02 LAB — IGG, IGA, IGM
IgA: 241 mg/dL (ref 90–386)
IgG (Immunoglobin G), Serum: 1147 mg/dL (ref 603–1613)
IgM (Immunoglobulin M), Srm: 141 mg/dL (ref 20–172)

## 2024-08-03 LAB — MISC LABCORP TEST (SEND OUT)
Labcorp test code: 144000
Labcorp test code: 550960

## 2024-08-04 LAB — ANTI-SMOOTH MUSCLE ANTIBODY, IGG: F-Actin IgG: 14 U (ref 0–19)

## 2024-08-04 LAB — MITOCHONDRIAL ANTIBODIES: Mitochondrial M2 Ab, IgG: <20 U (ref 0.0–20.0)

## 2024-08-05 ENCOUNTER — Encounter: Payer: Self-pay | Admitting: Gastroenterology

## 2024-08-05 ENCOUNTER — Other Ambulatory Visit: Payer: Self-pay | Admitting: *Deleted

## 2024-08-05 ENCOUNTER — Ambulatory Visit

## 2024-08-05 VITALS — BP 129/84 | HR 76 | Ht 67.0 in | Wt 170.0 lb

## 2024-08-05 DIAGNOSIS — K219 Gastro-esophageal reflux disease without esophagitis: Secondary | ICD-10-CM

## 2024-08-05 DIAGNOSIS — K7460 Unspecified cirrhosis of liver: Secondary | ICD-10-CM

## 2024-08-05 DIAGNOSIS — R1030 Lower abdominal pain, unspecified: Secondary | ICD-10-CM

## 2024-08-05 DIAGNOSIS — Z0000 Encounter for general adult medical examination without abnormal findings: Secondary | ICD-10-CM

## 2024-08-05 NOTE — Progress Notes (Signed)
 Established Patient Office Visit  Subjective   Patient ID: Tom Russell, male    DOB: 1970/12/03  Age: 53 y.o. MRN: [id number]  Chief Complaint  Patient presents with   Medical Management of Chronic Issues    CPE, Pt also was supposed t have fasting blood work done today, but he insisted on eating this am     HPI Discussed the use of AI scribe software for clinical note transcription with the patient, who gave verbal consent to proceed.  History of Present Illness   Tom Russell is a 53 year old male who presents for an annual physical exam.  General health status - No current complaints or symptoms - Presents for annual physical examination  Gastrointestinal symptoms - No constipation  Laboratory and imaging studies - Scheduled for CT scan - Blood work postponed due to eating this morning; plans to return for blood work without appointment      Patient Active Problem List   Diagnosis Date Noted   Spastic quadriplegic cerebral palsy (HCC) 11/24/2023   Stroke (HCC) 02/09/2023   GERD (gastroesophageal reflux disease) 12/11/2020   Encounter for screening colonoscopy 12/11/2020   Pain in right finger(s) 03/06/2019   Nausea with vomiting 11/14/2016   Dyspepsia 11/14/2016    ROS    Objective:     BP 129/84   Pulse 76   Ht 5' 7 (1.702 m)   Wt 170 lb (77.1 kg)   SpO2 (!) 88%   BMI 26.63 kg/m  BP Readings from Last 3 Encounters:  08/05/24 129/84  07/25/24 130/79  07/12/24 127/81   Wt Readings from Last 3 Encounters:  08/05/24 170 lb (77.1 kg)  07/25/24 168 lb (76.2 kg)  07/12/24 170 lb (77.1 kg)      Physical Exam Vitals and nursing note reviewed. Exam conducted with a chaperone present Davida, group home staff member).  Constitutional:      Appearance: Normal appearance.  HENT:     Head: Normocephalic.     Right Ear: Tympanic membrane, ear canal and external ear normal.     Left Ear: Tympanic membrane, ear canal and external ear normal.     Nose:  Nose normal.     Mouth/Throat:     Mouth: Mucous membranes are moist.     Pharynx: Oropharynx is clear.  Eyes:     General: Lids are normal.     Extraocular Movements: Extraocular movements intact.     Pupils: Pupils are equal, round, and reactive to light.     Comments: Gaze misaligned  Cardiovascular:     Rate and Rhythm: Normal rate and regular rhythm.  Pulmonary:     Effort: Pulmonary effort is normal.     Breath sounds: Normal breath sounds.  Abdominal:     General: Bowel sounds are normal.     Palpations: Abdomen is soft.     Tenderness: There is no right CVA tenderness or left CVA tenderness.  Musculoskeletal:        General: Normal range of motion.     Cervical back: Normal range of motion and neck supple.     Right lower leg: No edema.     Left lower leg: No edema.  Skin:    General: Skin is warm and dry.  Neurological:     Mental Status: He is alert and oriented to person, place, and time. Mental status is at baseline.     Gait: Gait abnormal (WC bound).  Psychiatric:  Mood and Affect: Mood normal.        Behavior: Behavior is cooperative.        Thought Content: Thought content normal.      No results found for any visits on 08/05/24.  Last CBC Lab Results  Component Value Date   WBC 3.8 (L) 08/01/2024   HGB 18.9 (H) 08/01/2024   HCT 52.3 (H) 08/01/2024   MCV 96.7 08/01/2024   MCH 34.9 (H) 08/01/2024   RDW 13.0 08/01/2024   PLT 82 (L) 08/01/2024   Last metabolic panel Lab Results  Component Value Date   GLUCOSE 100 (H) 08/01/2024   NA 144 08/01/2024   K 3.6 08/01/2024   CL 100 08/01/2024   CO2 34 (H) 08/01/2024   BUN 11 08/01/2024   CREATININE 0.65 08/01/2024   GFRNONAA >60 08/01/2024   CALCIUM 9.4 08/01/2024   PROT 7.0 08/01/2024   ALBUMIN 4.2 08/01/2024   BILITOT 1.0 08/01/2024   ALKPHOS 92 08/01/2024   AST 55 (H) 08/01/2024   ALT 45 (H) 08/01/2024   ANIONGAP 10 08/01/2024    The ASCVD Risk score (Arnett DK, et al., 2019) failed  to calculate for the following reasons:   Risk score cannot be calculated because patient has a medical history suggesting prior/existing ASCVD    Assessment & Plan:   Problem List Items Addressed This Visit   None Visit Diagnoses       Encounter for preventative adult health care examination    -  Primary   Routine wellness visit with no acute issues. Blood pressure normal. - to return for fasting labs.   Relevant Orders   CMP14+EGFR   HgB A1c   Lipid Profile   CBC       No follow-ups on file.    Leita Longs, FNP

## 2024-08-06 ENCOUNTER — Telehealth: Payer: Self-pay | Admitting: Gastroenterology

## 2024-08-06 ENCOUNTER — Other Ambulatory Visit: Payer: Self-pay | Admitting: *Deleted

## 2024-08-06 DIAGNOSIS — R899 Unspecified abnormal finding in specimens from other organs, systems and tissues: Secondary | ICD-10-CM

## 2024-08-06 NOTE — Telephone Encounter (Signed)
 Please arrange follow up for the patient with me in the next 3-4 weeks.   Tom Melia, MSN, APRN, FNP-BC, AGACNP-BC [facility] at [facility]

## 2024-08-07 DIAGNOSIS — Z0000 Encounter for general adult medical examination without abnormal findings: Secondary | ICD-10-CM | POA: Diagnosis not present

## 2024-08-08 ENCOUNTER — Ambulatory Visit: Payer: Self-pay | Admitting: Gastroenterology

## 2024-08-08 ENCOUNTER — Other Ambulatory Visit (HOSPITAL_COMMUNITY)
Admission: RE | Admit: 2024-08-08 | Discharge: 2024-08-08 | Disposition: A | Discharge status: Home / Self Care | Source: Ambulatory Visit | Attending: Gastroenterology | Admitting: Gastroenterology

## 2024-08-08 DIAGNOSIS — K7460 Unspecified cirrhosis of liver: Secondary | ICD-10-CM | POA: Diagnosis not present

## 2024-08-08 LAB — CMP14+EGFR
ALT: 40 IU/L (ref 0–44)
AST: 44 IU/L — ABNORMAL HIGH (ref 0–40)
Albumin: 4.1 g/dL (ref 3.8–4.9)
Alkaline Phosphatase: 90 IU/L (ref 47–123)
BUN/Creatinine Ratio: 23 — ABNORMAL HIGH (ref 9–20)
BUN: 17 mg/dL (ref 6–24)
Bilirubin Total: 1 mg/dL (ref 0.0–1.2)
CO2: 28 mmol/L (ref 20–29)
Calcium: 9.6 mg/dL (ref 8.7–10.2)
Chloride: 97 mmol/L (ref 96–106)
Creatinine, Ser: 0.74 mg/dL — ABNORMAL LOW (ref 0.76–1.27)
Globulin, Total: 2.4 g/dL (ref 1.5–4.5)
Glucose: 87 mg/dL (ref 70–99)
Potassium: 3.4 mmol/L — ABNORMAL LOW (ref 3.5–5.2)
Sodium: 143 mmol/L (ref 134–144)
Total Protein: 6.5 g/dL (ref 6.0–8.5)
eGFR: 108 mL/min/1.73 (ref >59)

## 2024-08-08 LAB — CBC
Hematocrit: 50.2 % (ref 37.5–51.0)
Hemoglobin: 17.7 g/dL (ref 13.0–17.7)
MCH: 34.6 pg — ABNORMAL HIGH (ref 26.6–33.0)
MCHC: 35.3 g/dL (ref 31.5–35.7)
MCV: 98 fL — ABNORMAL HIGH (ref 79–97)
Platelets: 91 x10E3/uL — CL (ref 150–450)
RBC: 5.11 x10E6/uL (ref 4.14–5.80)
RDW: 12.7 % (ref 11.6–15.4)
WBC: 4.2 x10E3/uL (ref 3.4–10.8)

## 2024-08-08 LAB — HEMOGLOBIN A1C
Est. average glucose Bld gHb Est-mCnc: 114 mg/dL
Hgb A1c MFr Bld: 5.6 % (ref 4.8–5.6)

## 2024-08-08 LAB — LIPID PANEL
Chol/HDL Ratio: 4.6 ratio (ref 0.0–5.0)
Cholesterol, Total: 174 mg/dL (ref 100–199)
HDL: 38 mg/dL — ABNORMAL LOW (ref >39)
LDL Chol Calc (NIH): 112 mg/dL — ABNORMAL HIGH (ref 0–99)
Triglycerides: 135 mg/dL (ref 0–149)
VLDL Cholesterol Cal: 24 mg/dL (ref 5–40)

## 2024-08-08 LAB — IRON AND TIBC
Iron: 214 ug/dL — ABNORMAL HIGH (ref 45–182)
Saturation Ratios: 90 % — ABNORMAL HIGH (ref 17.9–39.5)
TIBC: 237 ug/dL — ABNORMAL LOW (ref 250–450)
UIBC: 23 ug/dL

## 2024-08-08 LAB — FERRITIN: Ferritin: 2690 ng/mL — ABNORMAL HIGH (ref 24–336)

## 2024-08-09 ENCOUNTER — Other Ambulatory Visit (HOSPITAL_COMMUNITY)
Admission: RE | Admit: 2024-08-09 | Discharge: 2024-08-09 | Disposition: A | Discharge status: Home / Self Care | Source: Ambulatory Visit | Attending: Gastroenterology | Admitting: Gastroenterology

## 2024-08-09 ENCOUNTER — Other Ambulatory Visit: Payer: Self-pay | Admitting: *Deleted

## 2024-08-09 DIAGNOSIS — R7989 Other specified abnormal findings of blood chemistry: Secondary | ICD-10-CM | POA: Insufficient documentation

## 2024-08-09 DIAGNOSIS — R110 Nausea: Secondary | ICD-10-CM

## 2024-08-09 DIAGNOSIS — R1011 Right upper quadrant pain: Secondary | ICD-10-CM

## 2024-08-09 DIAGNOSIS — K7460 Unspecified cirrhosis of liver: Secondary | ICD-10-CM | POA: Insufficient documentation

## 2024-08-09 NOTE — Addendum Note (Signed)
 Addended by: GAYLENE MADELIN CROME on: 08/09/2024 11:08 AM   Modules accepted: Orders

## 2024-08-09 NOTE — Addendum Note (Signed)
 Addended by: GAYLENE MADELIN CROME on: 08/09/2024 11:31 AM   Modules accepted: Orders

## 2024-08-10 LAB — AFP TUMOR MARKER: AFP, Serum, Tumor Marker: 2.2 ng/mL (ref 0.0–8.4)

## 2024-08-15 LAB — HEMOCHROMATOSIS DNA-PCR(C282Y,H63D)

## 2024-08-16 ENCOUNTER — Encounter: Payer: Self-pay | Admitting: Gastroenterology

## 2024-08-16 ENCOUNTER — Ambulatory Visit: Payer: Self-pay | Admitting: Gastroenterology

## 2024-08-16 ENCOUNTER — Encounter (HOSPITAL_COMMUNITY): Payer: Self-pay

## 2024-08-16 ENCOUNTER — Ambulatory Visit (HOSPITAL_COMMUNITY)
Admission: RE | Admit: 2024-08-16 | Discharge: 2024-08-16 | Disposition: A | Discharge status: Home / Self Care | Source: Ambulatory Visit | Attending: Gastroenterology | Admitting: Gastroenterology

## 2024-08-16 DIAGNOSIS — R7989 Other specified abnormal findings of blood chemistry: Secondary | ICD-10-CM | POA: Insufficient documentation

## 2024-08-16 DIAGNOSIS — R110 Nausea: Secondary | ICD-10-CM | POA: Insufficient documentation

## 2024-08-16 DIAGNOSIS — R1011 Right upper quadrant pain: Secondary | ICD-10-CM | POA: Insufficient documentation

## 2024-08-16 MED ORDER — MORPHINE SULFATE (PF) 4 MG/ML IV SOLN
INTRAVENOUS | Status: AC
  Filled 2024-08-16: qty 1

## 2024-08-16 MED ORDER — TECHNETIUM TC 99M MEBROFENIN IV KIT
5.0000 | PACK | Freq: Once | INTRAVENOUS | Status: AC | PRN
  Administered 2024-08-16: 5 via INTRAVENOUS

## 2024-08-16 MED ORDER — MORPHINE SULFATE (PF) 4 MG/ML IV SOLN
3.0000 mg | Freq: Once | INTRAVENOUS | Status: AC
  Administered 2024-08-16: 3 mg via INTRAVENOUS
  Filled 2024-08-16: qty 0.8

## 2024-08-16 MED ORDER — MORPHINE BOLUS VIA INFUSION: 3.0000 mg | Freq: Once | INTRAVENOUS | Status: DC

## 2024-08-21 ENCOUNTER — Ambulatory Visit: Payer: Self-pay

## 2024-08-21 NOTE — Telephone Encounter (Signed)
 Patient advised urgent care.

## 2024-08-21 NOTE — Telephone Encounter (Signed)
 FYI Only or Action Required?: FYI only for provider: no available appt in home clinic today, care staff will take pt to UC for eval/treat today.  Patient was last seen in primary care on 08/05/2024 by Bevely Doffing, FNP.  Called Nurse Triage reporting Back Pain.  Symptoms began several days ago.  Interventions attempted: Nothing.  Symptoms are: unchanged.  Triage Disposition: See HCP Within 4 Hours (Or PCP Triage)  Patient/caregiver understands and will follow disposition?: Yes    Copied from CRM [id number]. Topic: Clinical - Red Word Triage >> Aug 21, 2024  8:32 AM Roselie BROCKS wrote: [facility] that prompted transfer to Nurse Triage: Patients Caregiver Jeffie Ober) , states the patient is complaining of bad back pains. Reason for Disposition  [1] Pain or burning with passing urine (urination) AND [2] flank (e.g., in side of back, below ribs and above hip)  Answer Assessment - Initial Assessment Questions 1. ONSET: When did the pain begin? (e.g., minutes, hours, days)     Daily since Friday c/o pain 2. LOCATION: Where does it hurt? (upper, mid or lower back)     Low back pain, right side 3. SEVERITY: How bad is the pain?  (e.g., Scale 1-10; mild, moderate, or severe)     4/10 4. PATTERN: Is the pain constant? (e.g., yes, no; constant, intermittent)      Intermittent 5. RADIATION: Does the pain shoot into your legs or somewhere else?     None 6. CAUSE:  What do you think is causing the back pain?      Unknown 7. BACK OVERUSE:  Any recent lifting of heavy objects, strenuous work or exercise?     No, pt is in a wheelchair 8. MEDICINES: What have you taken so far for the pain? (e.g., nothing, acetaminophen , NSAIDS)     Pt not currently taking any OTC 9. NEUROLOGIC SYMPTOMS: Do you have any weakness, numbness, or problems with bowel/bladder control?     None 10. OTHER SYMPTOMS: Do you have any other symptoms? (e.g., fever, abdomen pain, burning with  urination, blood in urine)       Abd pain, dysuria  Protocols used: Back Pain-A-AH

## 2024-08-23 ENCOUNTER — Encounter: Payer: Self-pay | Admitting: Neurology

## 2024-09-03 ENCOUNTER — Ambulatory Visit: Admission: RE | Admit: 2024-09-03 | Discharge: 2024-09-03 | Disposition: A | Source: Ambulatory Visit

## 2024-09-03 ENCOUNTER — Ambulatory Visit

## 2024-09-03 VITALS — BP 107/74 | HR 71 | Temp 98.8°F | Resp 13

## 2024-09-03 DIAGNOSIS — R7689 Other specified abnormal immunological findings in serum: Secondary | ICD-10-CM

## 2024-09-03 DIAGNOSIS — M1990 Unspecified osteoarthritis, unspecified site: Secondary | ICD-10-CM | POA: Diagnosis not present

## 2024-09-03 DIAGNOSIS — R4189 Other symptoms and signs involving cognitive functions and awareness: Secondary | ICD-10-CM

## 2024-09-03 DIAGNOSIS — F79 Unspecified intellectual disabilities: Secondary | ICD-10-CM | POA: Diagnosis not present

## 2024-09-03 NOTE — Progress Notes (Signed)
 Office Visit Note  Patient: Tom Russell             Date of Birth: 30-Apr-1971           MRN: [id number]             PCP: Bevely Doffing, FNP Referring: Kennedy Charmaine CROME, NP Visit Date: 09/03/2024 Occupation: Data Unavailable  Subjective:  New Patient (Initial Visit) (Abnormal Labs)   Discussed the use of AI scribe software for clinical note transcription with the patient, who gave verbal consent to proceed.  History of Present Illness Tom Russell is a 53 year old male who presents with abnormal lab results.  History is limited given cognitive impairment, he is accompanied by guardian. Today, he admits to soreness in one finger that began upon waking today. The pain is localized to a single finger, with no other joint pain or rashes. No recent illnesses or significant changes in health are noted. He denies any previous joint symptoms.  He experiences random incontinence, characterized by occasional inability to control urination, making it difficult to provide a urine sample today. No recent stomach pain is reported, although he acknowledges mild discomfort that has since improved. Recent abdominal pain lead to abnormal results requiring evaluation today. Also lead to recent diagnosis of hemochromatosis.  No recent rashes, significant joint pain aside from the finger, or recent illnesses. No current stomach pain and no other significant symptoms.     Activities of Daily Living:  Patient reports morning stiffness for 0 minutes.   Patient Denies nocturnal pain.  Difficulty dressing/grooming: Reports Difficulty climbing stairs: Reports Difficulty getting out of chair: Reports Difficulty using hands for taps, buttons, cutlery, and/or writing: Denies  Review of Systems  Constitutional:  Negative for fatigue.  HENT:  Negative for mouth sores and mouth dryness.   Eyes:  Positive for dryness.  Respiratory:  Negative for shortness of breath.   Cardiovascular:  Negative for chest  pain and palpitations.  Gastrointestinal:  Negative for blood in stool, constipation and diarrhea.  Endocrine: Negative for increased urination.  Genitourinary:  Negative for involuntary urination.  Musculoskeletal:  Positive for joint pain and joint pain. Negative for gait problem, joint swelling, myalgias, muscle weakness, morning stiffness, muscle tenderness and myalgias.  Skin:  Positive for sensitivity to sunlight. Negative for color change, rash and hair loss.  Allergic/Immunologic: Positive for susceptible to infections.  Neurological:  Negative for dizziness and headaches.  Hematological:  Negative for swollen glands.  Psychiatric/Behavioral:  Negative for depressed mood and sleep disturbance. The patient is not nervous/anxious.     PMFS History:  Patient Active Problem List   Diagnosis Date Noted   Spastic quadriplegic cerebral palsy (HCC) 11/24/2023   Stroke (HCC) 02/09/2023   GERD (gastroesophageal reflux disease) 12/11/2020   Encounter for screening colonoscopy 12/11/2020   Pain in right finger(s) 03/06/2019   Nausea with vomiting 11/14/2016   Dyspepsia 11/14/2016    Past Medical History:  Diagnosis Date   Anxiety    Cerebral palsy (HCC)    Depression    GERD (gastroesophageal reflux disease)    Mental retardation    Seizures (HCC)     Family History  Family history unknown: Yes   Past Surgical History:  Procedure Laterality Date   COLONOSCOPY WITH PROPOFOL  N/A 05/24/2023   Procedure: COLONOSCOPY WITH PROPOFOL ;  Surgeon: Shaaron Lamar HERO, MD;  Location: [facility];  Service: Endoscopy;  Laterality: N/A;  930am, [facility], group home patient   ESOPHAGOGASTRODUODENOSCOPY (  EGD) WITH PROPOFOL  N/A 11/23/2017   normal esophagus, single 4 mm semi-sessile polyp in gastric fundus, duodenal bulb normal, s/p biopsy. Fundic polyp, negative H.pylori.    None to date     as of 11/14/16   Social History   Tobacco Use   Smoking status: Never    Passive exposure: Never    Smokeless tobacco: Never  Vaping Use   Vaping status: Never Used  Substance Use Topics   Alcohol  use: No   Drug use: No   Social History   Social History Narrative   [facility]       Right hand         Immunization History  Administered Date(s) Administered   Influenza, Seasonal, Injecte, Preservative Fre 07/02/2024   Tdap 03/04/2014     Objective: Vital Signs: BP 107/74 (BP Location: Right Arm, Patient Position: Sitting, Cuff Size: Large)   Pulse 71   Temp 98.8 F (37.1 C)   Resp 13    Physical Exam Vitals and nursing note reviewed.  HENT:     Head: Normocephalic and atraumatic.     Nose: Nose normal.  Eyes:     Conjunctiva/sclera: Conjunctivae normal.     Pupils: Pupils are equal, round, and reactive to light.  Cardiovascular:     Rate and Rhythm: Normal rate and regular rhythm.  Pulmonary:     Effort: Pulmonary effort is normal. No respiratory distress.  Skin:    General: Skin is warm and dry.  Neurological:     Mental Status: He is alert. Mental status is at baseline.  Psychiatric:        Speech: Speech is delayed.        Behavior: Behavior normal.        Cognition and Memory: Cognition is impaired.      Musculoskeletal Exam:   CDAI Exam: CDAI Score: -- Patient Global: --; Provider Global: -- Swollen: 0 ; Tender: 1  Joint Exam 09/03/2024      Right  Left  PIP 4 (finger)      Tender     Investigation: No additional findings.  Imaging: NM Hepato W/EF Result Date: 08/16/2024 CLINICAL DATA:  Elevated liver function tests and right upper quadrant pain EXAM: NUCLEAR MEDICINE HEPATOBILIARY IMAGING TECHNIQUE: Sequential images of the abdomen were obtained out to 60 minutes following intravenous administration of radiopharmaceutical. When gallbladder activity could not be visualized, 3 mg of morphine  was administered intravenously, and additional imaging obtained. RADIOPHARMACEUTICALS:  5 mCi Tc-56m  Choletec  IV COMPARISON:  CT abdomen  and pelvis dated 07/19/2024, ultrasound abdomen dated 06/13/2024 FINDINGS: Prompt uptake and biliary excretion of activity by the liver is seen. Biliary activity passes into small bowel, consistent with patent common bile duct. Gallbladder activity is not visualized after 60 minutes. Following administration of morphine , gallbladder activity remains nonvisualized. IMPRESSION: 1. Nonvisualized gallbladder activity despite morphine  administration, suggestive of obstructed cystic duct, which can be seen in the setting of cholecystitis. 2. Patent common bile duct. These results will be called to the ordering clinician or representative by the Radiologist Assistant, and communication documented in the PACS or [employer]. Electronically Signed   By: Limin  Xu M.D.   On: 08/16/2024 16:37    Recent Labs: Lab Results  Component Value Date   WBC 4.2 08/07/2024   HGB 17.7 08/07/2024   PLT 91 (LL) 08/07/2024   NA 143 08/07/2024   K 3.4 (L) 08/07/2024   CL 97 08/07/2024   CO2 28  08/07/2024   GLUCOSE 87 08/07/2024   BUN 17 08/07/2024   CREATININE 0.74 (L) 08/07/2024   BILITOT 1.0 08/07/2024   ALKPHOS 90 08/07/2024   AST 44 (H) 08/07/2024   ALT 40 08/07/2024   PROT 6.5 08/07/2024   ALBUMIN 4.1 08/07/2024   CALCIUM 9.6 08/07/2024   GFRAA >60 10/16/2017    Speciality Comments: No specialty comments available.  Procedures:  No procedures performed Allergies: Patient has no known allergies.   Assessment / Plan:     Visit Diagnoses:   Positive ANA Positive Ravelo antibody Patient with positive ANA of unclear titer and mildly elevated Licausi abs. Patient does not have any features of SLE (no objective malar rash, inflammatory arthritis, Raynaud's, alopecia, recurrent cytopenias). Given patient is limited historian, and he did have mild leukopenia and thrombocytopenia on recent lab work-up will obtain C3/C4 and ANA titer level today. Would obtain urine but per guardian, would be extremely  difficult to obtain urine sample at this time. Will also obtain histone abs to rule out DIL given patient is currently on medications that can cause DIL (Carbamezapine).   However, leukopenia and thrombocytopenia can also be explained by his recent dx of cirrhosis so unclear if clinically significant at this time. Suspect mildly positive [place] abs is clinically insignificant at this time. A positive ANA need not represent presence of clinically active systemic autoimmune disease.  Can be positive in the normal population, autoimmune thyroid  disease (Graves' disease, Hashimoto's thyroiditis etc.), or infection. ANA can be positive in the healthy population at the following rates: ANA 1:40: 20%-30%, ANA 1:80: 10%-15%, ANA 1:160: 5%, ANA 1:320: 3% positive, healthy relative of an SLE patient: 5%-25% positive (usually low titers), and elderly (age >70 years): up to 70% positive at ANA titer 1:40.   Osteoarthritis  Patient with significant evidence of OA on exam w/ nodular changes. He denies any concerning joint pain at this time, except for mild tenderness in his left 4th PIP upon waking this morning. Given association of hemochromatosis and secondary OA, suspect that is likely the cause of his significant OA. Will obtain DG Hand 2 View Right, DG Hand 2 View Left today.   Orders: Orders Placed This Encounter  Procedures   DG Hand 2 View Right   DG Hand 2 View Left   C3 and C4   CBC with Differential/Platelet   ANA   Histone antibodies, IgG, blood   No orders of the defined types were placed in this encounter.  I personally spent a total of 60 minutes in the care of the patient today including preparing to see the patient, getting/reviewing separately obtained history, performing a medically appropriate exam/evaluation, counseling and educating, placing orders, referring and communicating with other health care professionals, documenting clinical information in the EHR, and independently interpreting  results.   Follow-Up Instructions: Return if symptoms worsen or fail to improve.   Asberry Claw, DO

## 2024-09-09 LAB — CBC WITH DIFFERENTIAL/PLATELET
Absolute Lymphocytes: 1751 {cells}/uL (ref 850–3900)
Absolute Monocytes: 273 {cells}/uL (ref 200–950)
Basophils Absolute: 21 {cells}/uL (ref 0–200)
Basophils Relative: 0.5 %
Eosinophils Absolute: 0 {cells}/uL — ABNORMAL LOW (ref 15–500)
Eosinophils Relative: 0 %
HCT: 49.6 % (ref 39.4–51.1)
Hemoglobin: 17.4 g/dL — ABNORMAL HIGH (ref 13.2–17.1)
MCH: 34.2 pg — ABNORMAL HIGH (ref 27.0–33.0)
MCHC: 35.1 g/dL (ref 31.6–35.4)
MCV: 97.4 fL (ref 81.4–101.7)
MPV: 10.2 fL (ref 7.5–12.5)
Monocytes Relative: 6.5 %
Neutro Abs: 2155 {cells}/uL (ref 1500–7800)
Neutrophils Relative %: 51.3 %
Platelets: 79 Thousand/uL — ABNORMAL LOW (ref 140–400)
RBC: 5.09 Million/uL (ref 4.20–5.80)
RDW: 13.4 % (ref 11.0–15.0)
Total Lymphocyte: 41.7 %
WBC: 4.2 Thousand/uL (ref 3.8–10.8)

## 2024-09-09 LAB — HISTONE ANTIBODIES, IGG, BLOOD: Histone Antibodies: 5.3 U — ABNORMAL HIGH (ref <1.0)

## 2024-09-09 LAB — C3 AND C4
C3 Complement: 120 mg/dL (ref 82–185)
C4 Complement: 16 mg/dL (ref 15–53)

## 2024-09-09 LAB — ANA: Anti Nuclear Antibody (ANA): POSITIVE — AB

## 2024-09-09 LAB — ANTI-NUCLEAR AB-TITER (ANA TITER): ANA Titer 1: 1:40 {titer} — ABNORMAL HIGH

## 2024-09-10 ENCOUNTER — Encounter: Payer: Self-pay | Admitting: Gastroenterology

## 2024-09-10 ENCOUNTER — Ambulatory Visit: Admitting: Gastroenterology

## 2024-09-10 VITALS — BP 118/83 | HR 76 | Temp 97.8°F

## 2024-09-10 DIAGNOSIS — K7469 Other cirrhosis of liver: Secondary | ICD-10-CM | POA: Diagnosis not present

## 2024-09-10 DIAGNOSIS — K921 Melena: Secondary | ICD-10-CM

## 2024-09-10 DIAGNOSIS — K8011 Calculus of gallbladder with chronic cholecystitis with obstruction: Secondary | ICD-10-CM

## 2024-09-10 DIAGNOSIS — R1011 Right upper quadrant pain: Secondary | ICD-10-CM

## 2024-09-10 DIAGNOSIS — D696 Thrombocytopenia, unspecified: Secondary | ICD-10-CM

## 2024-09-10 DIAGNOSIS — K820 Obstruction of gallbladder: Secondary | ICD-10-CM

## 2024-09-10 DIAGNOSIS — K219 Gastro-esophageal reflux disease without esophagitis: Secondary | ICD-10-CM | POA: Diagnosis not present

## 2024-09-10 DIAGNOSIS — K5904 Chronic idiopathic constipation: Secondary | ICD-10-CM

## 2024-09-10 NOTE — Patient Instructions (Addendum)
 Cirrhosis Lifestyle Recommendations:  High-protein diet from a primarily plant-based diet. Avoid red meat.  No raw or undercooked meat, seafood, or shellfish. Low-fat/cholesterol/carbohydrate diet. Limit sodium to no more than 2000 mg/day including everything that you eat and drink. Recommend at least 30 minutes of aerobic and resistance exercise 3 days/week. Limit Tylenol  to 2000 mg daily.   In regards to the obstructive cystic duct and pain  with eating: I will reach out to a colleague in [place] to see if he would be a candidate for stenting of the cystic duct or an ERCP to further assess this area to try to avoid surgery given in the setting of his cirrhosis he is at high risk for postoperative complications and mortality for gallbladder removal.  He needs to continue on a strict low-fat diet to avoid further complications.  If he develops any syrupy or abdominal pain, yellowing of the skin or eyes, fevers he needs to be taken to the ED immediately.  Given constipation appears to be much more well-controlled we will continue MiraLAX twice daily.  If he begins to have any more frequent reports of pain in the upper abdomen with eating then we also can consider increasing his omeprazole  from 20 mg to 40 mg twice a day.  Please call the office and let me know and I can update the prescription if it is observed that he is having more complaints.  Given the abnormal findings on the CT scan in October, radiology recommended 1-month follow-up and therefore we will need to complete another CT scan of the abdomen and pelvis in January.  Because of the cirrhosis and his low platelet counts, we will discuss in February about scheduling an upper endoscopy to screen for esophageal varices which can cause bleeding, this is related to the cirrhosis.  He will need at minimum imaging every 6 months of his liver and labs every 6 months which we will keep track of.  I will see him again for follow-up in  February to reassess his peripheral edema and follow-up on his cirrhosis and the repeat CT scan.  It was a pleasure to see you today. I want to create trusting relationships with patients. If you receive a survey regarding your visit,  I greatly appreciate you taking time to fill this out on paper or through your MyChart. I value your feedback.  Charmaine Melia, MSN, FNP-BC, AGACNP-BC [facility]

## 2024-09-10 NOTE — Progress Notes (Signed)
 GI Office Note    Referring Provider: Bevely Doffing, FNP Primary Care Physician:  Bevely Doffing, FNP Primary Gastroenterologist: Lamar HERO.Rourk, MD  Date:  09/10/2024  ID:  Tom Russell, DOB December 26, 1970, MRN [id number]  Chief Complaint   Chief Complaint  Patient presents with   Follow-up    Follow up. No problems    History of Present Illness  [name] is a 53 y.o. male with a history of CP, GERD, anxiety, depression, and seizures presenting today with caregiver for follow up and discuss RUQ pain and recent imaging.   OV 04/11/2023.  Caregiver reported having too many loose stools with MiraLAX and was having more accidents.  Patient unable to tell when he has done a bowel movement.  Having some lower abdominal pain.  Has a good appetite.  MiraLAX was placed as needed given to me looser stools.  Patient reportedly talked to his sister about a colonoscopy but she did not say anything about it.  Denied any nausea or vomiting.  Doing okay in regards to swallowing.  Reflux controlled.  Advise continue omeprazole  20 mg twice daily, GERD diet.  Keep MiraLAX as needed and start Metamucil or Benefiber daily.  Flexible sigmoidoscopy scheduled..   Colonoscopy 05/24/2023: -Redundant and elongated colon -Entire examined colon normal    OV 09/12/23.  Sometimes able to go to the restroom, sometimes cannot.  Does have to strain at times.  Having pain in the lower abdomen mostly with bowel movements.  Not having as many accidents as previously.  Has a good appetite and eating well.  Denied any epigastric pain or burning, nausea, vomiting, or dysphagia. Advised to continue omeprazole  20 mg twice daily, famotidine 40 mg nightly, GERD diet.  Continue MiraLAX daily as well as starting Senokot nightly and started daily fiber supplementation once in the mornings.   OV 12/13/23.  Caregiver reported some loss of sensation and going to PT and receiving Botox  injections.  Have been doing well with dietary changes  and was doing well on this medication twice daily for reflux.  Doing well without any upper or lower GI symptoms.  Advise increasing Metamucil to 1 tablespoon twice daily and continue MiraLAX once daily and stop senna and continue toileting schedule.  Continue omeprazole  20 mg twice daily.  Follow-up 4 months.   Visit with PCP 05/29/24 for RLQ pain. Present for 2 weeks. Regular Bms. Abdominal US  ordered.    RUQ US  06/13/24: - Cholelithiasis and distended gallbladder - no overt cholecystitis - Increased liver echotexture with expansion in left liver - possible lobular appearance (could be nonspecific, correlate for cirrhosis) - Radiology recommended consideration of HIDA if ongoing pain.  Last office visit 06/20/2024. Having frequent abdominal pain for 3 weeks, reportedly more lower abdomen and happening in the mornings. No changes in diet. Having issues with constipation, not using stool softeners. Good appetite. No chest pain or SOB, no jaundice. Ordered CT scan abdomen pelvis to further assess for the liver cirrhosis, additional labs ordered as well.  Consider HIDA scan if symptoms of right upper quadrant pain and nausea persist after CT performed.  In regards to constipation advised increase MiraLAX to twice daily and consider resuming omeprazole  20 mg twice daily if his upper abdominal pain and nausea persist after his next visit.  CT A/P with contrast 07/19/2024: IMPRESSION: - No evidence of cholecystitis or other acute findings. - Hepatic cirrhosis and findings of portal venous hypertension. No evidence of hepatic neoplasm. - Shotty retroperitoneal lymphadenopathy, which  is nonspecific and may be reactive in setting of cirrhosis. If there is no history of malignancy or clinical signs of lymphoproliferative disorder, consider followup by CT in 3 months. - Mildly enlarged prostate, and findings of chronic bladder outlet obstruction.  Labs 08/01/2024: AMA, ASMA, immunoglobulins all normal/negative.   INR 1.1.  FibroSure with F4-cirrhosis and severe steatosis.  Elf elevated at 12.25 indicating high risk of cirrhosis.  Ferritin is significant elevated at 2896.  Positive ANA.  Viral hepatitis panel negative.  Follow-up labs were ordered.  Labs 08/07/2024: Iron 214, saturation 90%, ferritin 2690, hemoglobin 17.7.  Hemochromatosis DNA positive for homozygous C.  845G>A (p.Cys282Tyr) gene which supports HFE related hereditary hemochromatosis.  aFP 2.2.  Referred to hematology..  Advised to stop any iron supplementation and vitamin C supplements and focus on hydration.  HIDA scan 08/16/2024: IMPRESSION: 1. Nonvisualized gallbladder activity despite morphine  administration, suggestive of obstructed cystic duct, which can be seen in the setting of cholecystitis. 2. Patent common bile duct  On 08/16/2024: Spoke to Lauree Ober - caregiver in detail regarding his recent lab findings including his diagnosis of cirrhosis and the findings of his HIDA scan indicating possible cystitis, likely chronic given how long he has been experienc intermittent abdominal pain.ing he does have a patent common bile duct but he has no gallbladder activity/reuptake of morphine  and the gallbladder is not visualized during this time.  Which is suggestive of obstructive cystic duct.   Spoke with house manager Lauree Ober 08/16/24 - I spoke with her and he is not showing any signs of cholangitis at this time, he has not jaundice, has not had any fevers and is eating well.  Also recently has not complained of any abdominal pain but they have been working toward getting him to make better dietary choices and avoiding fried foods and higher fat content foods and trying to have him consume more grilled foods. Given he is without any significant abdominal pain currently, tolerating diet, and no signs of infection I advised them to continue to monitor for these and if he should develop any fever, decreased p.o. intake, severe abdominal  pain that he should be brought to the ED for evaluation.  In the setting of his cirrhosis there is high risk of morbidity and complications in regards to potential surgical management however he has a low MELD score of 7 at this time. I encouraged her to reach out to his legal guardian to let her know I am more than happy to reach out with her to explain his condition further but may would be best for her to attend his upcoming follow-up with me in December to further educate and answer questions in more detail.  The important thing is to monitor for signs of decompensation which I discussed with his caregiver today.  We will provide dietary recommendations and separate written education regarding monitoring in regards to his cirrhosis and chronic cholecystitis. I also reiterated to her that his hematology appointment is to assist in management of his hemochromatosis.  He has an appointment with hematology scheduled for 09/13/2024.  Has been seen by rheumatology on 09/03/2024 in which they ordered further serologic workup and notes evidence of OA on exam with nodular changes to his joints.  Also ordered hand x-rays.  Today:  Discussed the use of AI scribe software for clinical note transcription with the patient, who gave verbal consent to proceed.  HPI limited given cognitive delay. Caregiver also helps with some history.   He experiences  abdominal pain primarily located in the middle of his abdomen, just above the umbilicus, which occurs when he eats. The pain is more central rather than upper abdominal. He is eating every two hours. The pain is not associated with nausea or vomiting. No chest pain or dysphagia.  He reports mild peripheral edema, particularly around the ankles, which sometimes extends up the legs. He also reports seeing a small amount of blood when wiping, which he attributes to hemorrhoids. No recent changes in bowel habits, stating that his bowel movements are regular and he is not  straining.  He is currently taking omeprazole  20 mg twice daily for reflux and reports no breakthrough symptoms. He is also using Miralax twice daily to manage constipation, which seems to be effective as he reports regular bowel movements.      Wt Readings from Last 6 Encounters:  08/05/24 170 lb (77.1 kg)  07/25/24 168 lb (76.2 kg)  07/12/24 170 lb (77.1 kg)  05/29/24 170 lb (77.1 kg)  04/11/24 165 lb (74.8 kg)  03/12/24 150 lb (68 kg)    There is no height or weight on file to calculate BMI.   Current Outpatient Medications  Medication Sig Dispense Refill   ARIPiprazole (ABILIFY) 5 MG tablet Take 5 mg by mouth every morning.     carbamazepine  (CARBATROL ) 300 MG 12 hr capsule Take 300 mg by mouth in the morning, at noon, and at bedtime.     clotrimazole (LOTRIMIN) 1 % cream Apply 1 application  topically daily at 6 (six) AM.     cromolyn (OPTICROM) 4 % ophthalmic solution Place 1 drop into both eyes 2 (two) times daily.     diazepam (VALIUM) 5 MG tablet Take 5 mg by mouth 2 (two) times daily as needed.     fluvoxaMINE (LUVOX) 100 MG tablet Take 100 mg by mouth 2 (two) times daily.     gabapentin (NEURONTIN) 800 MG tablet Take 800 mg by mouth in the morning, at noon, in the evening, and at bedtime. (0800, 1200, 1600 & 2000)     GOODSENSE CLEARLAX 17 GM/SCOOP powder Take 17 g by mouth in the morning and at bedtime. 238 g 3   hydrochlorothiazide (MICROZIDE) 12.5 MG capsule Take 12.5 mg by mouth daily.     Incontinence Supply Disposable (PREVAIL BOXERS FOR MEN S-M) MISC by Miscellaneous route.     nystatin-triamcinolone (MYCOLOG II) cream Apply 1 Application topically 3 (three) times daily.     omeprazole  (PRILOSEC) 20 MG capsule Take 20 mg by mouth 2 (two) times daily.     PATADAY 0.7 % SOLN Place 1 drop into both eyes every morning.     potassium chloride (KLOR-CON) 8 MEQ tablet Take 8 mEq by mouth daily.     tamsulosin (FLOMAX) 0.4 MG CAPS capsule Take 0.4 mg by mouth daily.      TERBINAFINE EX Apply topically. BID fungus on toenails     triamcinolone lotion (KENALOG) 0.1 % Apply 1 Application topically in the morning and at bedtime.     UNABLE TO FIND Med Name: Incontinence Disposable Diapers Mens Large 1 Box 5   Wheat Dextrin (BENEFIBER) POWD Mix 1 tablespoon in 8 ounces of water  and give twice daily. 245 g 1   zolpidem (AMBIEN) 5 MG tablet Take 5 mg by mouth at bedtime.     UNABLE TO FIND Med Name: Medium Gloves (Patient not taking: Reported on 09/03/2024) 1 Box 5   UNABLE TO FIND Med Name: Large Gloves (  Patient not taking: Reported on 09/03/2024) 1 Box 5   No current facility-administered medications for this visit.    Past Medical History:  Diagnosis Date   Anxiety    Cerebral palsy (HCC)    Depression    GERD (gastroesophageal reflux disease)    Mental retardation    Seizures (HCC)     Past Surgical History:  Procedure Laterality Date   COLONOSCOPY WITH PROPOFOL  N/A 05/24/2023   Procedure: COLONOSCOPY WITH PROPOFOL ;  Surgeon: Shaaron Lamar HERO, MD;  Location: [facility];  Service: Endoscopy;  Laterality: N/A;  930am, asa 3, group home patient   ESOPHAGOGASTRODUODENOSCOPY (EGD) WITH PROPOFOL  N/A 11/23/2017   normal esophagus, single 4 mm semi-sessile polyp in gastric fundus, duodenal bulb normal, s/p biopsy. Fundic polyp, negative H.pylori.    None to date     as of 11/14/16    Family History  Family history unknown: Yes    Allergies as of 09/10/2024   (No Known Allergies)    Social History   Socioeconomic History   Marital status: Single    Spouse name: Not on file   Number of children: Not on file   Years of education: Not on file   Highest education level: Not on file  Occupational History   Not on file  Tobacco Use   Smoking status: Never    Passive exposure: Never   Smokeless tobacco: Never  Vaping Use   Vaping status: Never Used  Substance and Sexual Activity   Alcohol  use: No   Drug use: No   Sexual activity: Never    Birth  control/protection: None  Other Topics Concern   Not on file  Social History Narrative   [facility]       Right hand       Social Drivers of Health   Financial Resource Strain: Low Risk  (07/25/2024)   Overall Financial Resource Strain (CARDIA)    Difficulty of Paying Living Expenses: Not hard at all  Food Insecurity: No Food Insecurity (07/25/2024)   Hunger Vital Sign    Worried About Running Out of Food in the Last Year: Never true    Ran Out of Food in the Last Year: Never true  Transportation Needs: No Transportation Needs (07/25/2024)   PRAPARE - Administrator, Civil Service (Medical): No    Lack of Transportation (Non-Medical): No  Physical Activity: Inactive (07/25/2024)   Exercise Vital Sign    Days of Exercise per Week: 0 days    Minutes of Exercise per Session: 0 min  Stress: No Stress Concern Present (07/25/2024)   [employer] of Occupational Health - Occupational Stress Questionnaire    Feeling of Stress: Not at all  Social Connections: Unknown (07/25/2024)   Social Connection and Isolation Panel    Frequency of Communication with Friends and Family: More than three times a week    Frequency of Social Gatherings with Friends and Family: More than three times a week    Attends Religious Services: More than 4 times per year    Active Member of [employer] or Organizations: No    Attends Banker Meetings: Never    Marital Status: Patient declined    Review of Systems  .  Resp: Denies dyspnea at rest, cough, wheezing, coughing up blood, and pleurisy. GI: See HPI Derm: Denies rash, itching, dry skin Heme: Denies bruising, bleeding, and enlarged lymph nodes.  Physical Exam   BP 118/83 (BP Location: Left  Arm, Patient Position: Sitting, Cuff Size: Normal)   Pulse 76   Temp 97.8 F (36.6 C) (Temporal)   General:   Alert and oriented. No distress noted. Pleasant and cooperative.  Head:  Normocephalic and atraumatic. Eyes:   Conjuctiva clear without scleral icterus. Dysconjugate. Mouth:  Oral mucosa pink and moist. No lesions. Lungs:  Clear to auscultation bilaterally. No wheezes, rales, or rhonchi. No distress.  Heart:  S1, S2 present without murmurs appreciated.  Abdomen:  +BS, soft, non-tender and non-distended. No rebound or guarding. No HSM or masses noted. Rectal: deferred Msk:  Normal posture. Sitting in wheelchair. Extremities: Mild non pitting edema. Nodularity to joints.  Neurologic:  Alert and oriented x3.  Psych:  Alert and cooperative. Normal mood and affect.  Assessment & Plan  Tom Russell is a 54 y.o. male presenting today for follow-up of cirrhosis, obstruction of cystic duct, and discuss recent testing and monitoring.     Chronic cholecystitis with biliary colic and cystic duct obstruction Chronic cholecystitis with biliary colic and cystic duct obstruction noted on HIDA scna. Pain localized to the middle and upper abdomen (previously isolated to RUQ), exacerbated by eating. No fever or signs of cholangitis. Laparoscopic cholecystectomy not recommended due to high risk of postoperative complications and mortality in the context of cirrhosis given evidence of portal hypertension. Dietary modifications are preferred to manage symptoms. Ni signs of cholangitis.  - Continue dietary modifications: avoid fried foods, eat smaller, more frequent meals. - Will consider second opinion regarding cystic duct stenting to help with symptoms. Will discuss ERCP vs EUS with Dr. Wilhelmenia - Monitor for signs of cholangitis or severe abdominal pain; will consider cholecystostomy versus ERCP with stenting if symptoms worsen at that time.   Cirrhosis with portal hypertension and thrombocytopenia, retroperitoneal lymphadenopathy Cirrhosis with portal hypertension and thrombocytopenia. No signs of worsening mental status or significant ascites. Mild peripheral edema present. MELD score remains low at 7. Imaging  shows retroperitoneal lymphadenopathy, possibly reactive to cirrhosis. Hemochromatosis likely contributor to his cirrhosis along with metabolic disease. No concern for cholangitis.  - Scheduled follow-up CT scan next month to monitor retroperitoneal lymphadenopathy. - Continue to monitor MELD score and AFP every six months. - Will consider upper endoscopy for esophageal varices screening due to thrombocytopenia. - MELD labs and imaging for hepatoma screening every 6 months. - Reinforced lifestyle modifications and dietary modifications - If ongoing peripheral edema then we will need to consider initiation of diuretics.  - ED precautions reinforced on instructions - monitor for signs of cholangitis.   Hereditary hemochromatosis Confirmed by positive genotype and elevated iron saturation and ferritin levels. Could be contributor to cirrhosis. - Continue follow-up with hematology to assess severity and treatment goals.  Gastroesophageal reflux disease Currently managed with omeprazole  20 mg twice daily. No breakthrough symptoms reported. Unclear if abdominal pain is related to reflux or biliary issues. - Will consider increasing omeprazole  to twice daily if symptoms worsen. - Will reassess at office visit in February.  Constipation Regular bowel movements reported. No significant lower abdominal pain.  - Continue Miralax 17g twice daily.  Hemorrhoids with minor rectal bleeding Minor rectal bleeding likely due to hemorrhoids. [place] colonoscopy [place] [id number]. No significant changes in bowel habits or straining reported. - Continue to monitor for any worsening of symptoms.      Follow up   Follow up February 2026. CT scan in January 2026,    Charmaine Melia, MSN, FNP-BC, AGACNP-BC [facility]

## 2024-09-13 ENCOUNTER — Inpatient Hospital Stay

## 2024-09-13 ENCOUNTER — Inpatient Hospital Stay: Admitting: Hematology

## 2024-09-13 NOTE — Telephone Encounter (Signed)
 I spoke with Dr. Wilhelmenia regarding the patient's HIDA scan findings of obstructive cystic and he suggested a cystic duct stent could be considered and would need to be special-education in order to better continue this or an EUS with gallbladder drainage may be considered however in the setting of his cirrhosis and other comorbidities he may be best served at a coronary center to discuss definitive treatment and to ensure he is a nonsurgical candidate first.  Given this can we please place an urgent referral to [facility] hepatobiliary clinic for consideration of cholecystectomy versus other treatment for the diagnoses of possible chronic cholecystitis, cholelithiasis, biliary colic, and obstructed cystic duct.   Charmaine Melia, MSN, APRN, FNP-BC, AGACNP-BC [facility]

## 2024-09-19 NOTE — Progress Notes (Signed)
 Referral has been sent as Urgent to [facility]

## 2024-09-23 ENCOUNTER — Inpatient Hospital Stay: Attending: Family | Admitting: Family

## 2024-09-23 ENCOUNTER — Other Ambulatory Visit: Payer: Self-pay

## 2024-09-23 ENCOUNTER — Other Ambulatory Visit: Payer: Self-pay | Admitting: Family

## 2024-09-23 ENCOUNTER — Inpatient Hospital Stay

## 2024-09-23 VITALS — BP 119/81 | HR 71 | Temp 97.9°F | Resp 16

## 2024-09-23 DIAGNOSIS — Z79899 Other long term (current) drug therapy: Secondary | ICD-10-CM | POA: Diagnosis not present

## 2024-09-23 DIAGNOSIS — D649 Anemia, unspecified: Secondary | ICD-10-CM

## 2024-09-23 LAB — CBC WITH DIFFERENTIAL/PLATELET
Abs Immature Granulocytes: 0.01 K/uL (ref 0.00–0.07)
Basophils Absolute: 0 K/uL (ref 0.0–0.1)
Basophils Relative: 1 %
Eosinophils Absolute: 0 K/uL (ref 0.0–0.5)
Eosinophils Relative: 0 %
HCT: 50.6 % (ref 39.0–52.0)
Hemoglobin: 18 g/dL — ABNORMAL HIGH (ref 13.0–17.0)
Immature Granulocytes: 0 %
Lymphocytes Relative: 41 %
Lymphs Abs: 1.7 K/uL (ref 0.7–4.0)
MCH: 34.3 pg — ABNORMAL HIGH (ref 26.0–34.0)
MCHC: 35.6 g/dL (ref 30.0–36.0)
MCV: 96.4 fL (ref 80.0–100.0)
Monocytes Absolute: 0.3 K/uL (ref 0.1–1.0)
Monocytes Relative: 7 %
Neutro Abs: 2.1 K/uL (ref 1.7–7.7)
Neutrophils Relative %: 51 %
Platelets: 85 K/uL — ABNORMAL LOW (ref 150–400)
RBC: 5.25 MIL/uL (ref 4.22–5.81)
RDW: 13 % (ref 11.5–15.5)
WBC: 4.2 K/uL (ref 4.0–10.5)
nRBC: 0 % (ref 0.0–0.2)

## 2024-09-23 LAB — IRON AND TIBC
Iron: 213 ug/dL — ABNORMAL HIGH (ref 45–182)
Saturation Ratios: 91 % — ABNORMAL HIGH (ref 17.9–39.5)
TIBC: 235 ug/dL — ABNORMAL LOW (ref 250–450)
UIBC: 22 ug/dL

## 2024-09-23 LAB — COMPREHENSIVE METABOLIC PANEL WITH GFR
ALT: 57 U/L — ABNORMAL HIGH (ref 0–44)
AST: 58 U/L — ABNORMAL HIGH (ref 15–41)
Albumin: 4.5 g/dL (ref 3.5–5.0)
Alkaline Phosphatase: 103 U/L (ref 38–126)
Anion gap: 13 (ref 5–15)
BUN: 14 mg/dL (ref 6–20)
CO2: 29 mmol/L (ref 22–32)
Calcium: 9.7 mg/dL (ref 8.9–10.3)
Chloride: 101 mmol/L (ref 98–111)
Creatinine, Ser: 0.54 mg/dL — ABNORMAL LOW (ref 0.61–1.24)
GFR, Estimated: >60 mL/min (ref >60)
Glucose, Bld: 99 mg/dL (ref 70–99)
Potassium: 3.4 mmol/L — ABNORMAL LOW (ref 3.5–5.1)
Sodium: 143 mmol/L (ref 135–145)
Total Bilirubin: 0.8 mg/dL (ref 0.0–1.2)
Total Protein: 7.1 g/dL (ref 6.5–8.1)

## 2024-09-23 LAB — FERRITIN: Ferritin: 2865 ng/mL — ABNORMAL HIGH (ref 24–336)

## 2024-09-23 NOTE — Progress Notes (Addendum)
 Hematology/Oncology Consultation   Name: Tom Russell      MRN: [id number]    Location: Room/bed info not found  Date: 09/23/2024 Time:3:18 PM   REFERRING PHYSICIAN: Charmaine Melia, NP  REASON FOR CONSULT:  Hemochromatosis    DIAGNOSIS: Hereditary hemochromatosis, homozygous for the C282Y mutation  HISTORY OF PRESENT ILLNESS:  Tom Russell is present at [facility] and I am present at [facility] for our video visit today. He is a very pleasant 53 yo caucasian gentleman with recent diagnosis of hemochromatosis. He is homozygous for the C282Y mutation.  His iron saturation in October was 90% and ferritin 2,690.  So far, it sounds like he has not been phlebotomized for these high levels.  He had an US  and CT of the abdomen in sept/Oct which showed cirrhosis and mild splenomegaly with portal venous hypertension.   He is not taking any iron supplementation.  He states that he only occasional has a stomach ache with GERD and is on prilosec BID.   He has history of spastic quadriplegic cerebral palsy and is in a wheelchair.  He lives in a group home and has [name], his caregiver, from there with him at his visit today.  He has history of seizures and is followed by neurology.  No falls or syncope reported.  Patient had a stroke in 2024 and states that he has changed to a healthier diet. He also states that his maternal grandmother had history of stroke as well.  No known familial history of liver disease.  No history of diabetes or thyroid  disease.  Colonoscopy in 05/2023 showed a redundant colon but was otherwise normal. He had 1 benign gastric polyp removed with EGD in 11/2017.  No fever, chills, n/v, cough, rash, dizziness, SOB, chest pain, palpitations or changes in bowel or bladder habits.  He has occasional blood in the stool with straining.  No other blood loss noted.  No smoking, ETOH or recreational drug use.  Appetite and hydration are good.   ROS: All other 10 point review of systems is  negative.   PAST MEDICAL HISTORY:   Past Medical History:  Diagnosis Date   Anxiety    Cerebral palsy (HCC)    Depression    GERD (gastroesophageal reflux disease)    Mental retardation    Seizures (HCC)     ALLERGIES: Allergies[1]    MEDICATIONS:  Medications Ordered Prior to Encounter[2]   PAST SURGICAL HISTORY Past Surgical History:  Procedure Laterality Date   COLONOSCOPY WITH PROPOFOL  N/A 05/24/2023   Procedure: COLONOSCOPY WITH PROPOFOL ;  Surgeon: Shaaron Lamar HERO, MD;  Location: [facility];  Service: Endoscopy;  Laterality: N/A;  930am, asa 3, group home patient   ESOPHAGOGASTRODUODENOSCOPY (EGD) WITH PROPOFOL  N/A 11/23/2017   normal esophagus, single 4 mm semi-sessile polyp in gastric fundus, duodenal bulb normal, s/p biopsy. Fundic polyp, negative H.pylori.    None to date     as of 11/14/16    FAMILY HISTORY: Family History  Family history unknown: Yes    SOCIAL HISTORY:  reports that he has never smoked. He has never been exposed to tobacco smoke. He has never used smokeless tobacco. He reports that he does not drink alcohol  and does not use drugs.  PERFORMANCE STATUS: The patient's performance status is 2 - Symptomatic, <50% confined to bed  PHYSICAL EXAM: Most Recent Vital Signs: There were no vitals taken for this visit. BP 119/81 (BP Location: Left Arm, Patient Position: Sitting)   Pulse 71  Temp 97.9 F (36.6 C) (Oral)   Resp 16   SpO2 96%   General Appearance:    Alert, cooperative, no distress, appears stated age  Head:    Normocephalic, without obvious abnormality, atraumatic  Eyes:    PERRL, conjunctiva/corneas clear, EOM's intact, fundi    benign, both eyes             Throat:   Lips, mucosa, and tongue normal; teeth and gums normal  Neck:   Supple, symmetrical, trachea midline, no adenopathy;       thyroid :  No enlargement/tenderness/nodules; no carotid   bruit or JVD  Back:     Kyphosis and muscle atrophy with cerebral palsy  Lungs:      Clear to auscultation bilaterally, respirations unlabored  Chest wall:    No tenderness or deformity     Abdomen:     Soft, non-tender, bowel sounds active all four quadrants,    no masses, no organomegaly        Extremities:   Musckle atrophy and weakness with cerebral palsy     Skin:   Skin color, texture, turgor normal, no rashes or lesions          LABORATORY DATA:  No results found for this or any previous visit (from the past 48 hours).    RADIOGRAPHY: No results found.     PATHOLOGY: None   ASSESSMENT/PLAN: Tom Russell is a very pleasant 54 yo caucasian gentleman with recent diagnosis of hemochromatosis, homozygous for the C282Y mutation.  His iron saturation in October was 90% and ferritin 2,690.  So far, it sounds like he has not been phlebotomized for these high levels. We will get him started as soon as possible.  We will phlebotomize him weekly for 5 weeks.  At his 5th week we will do a lab check and follow-up to see how he is feeling.   All questions were answered. The patient knows to call the clinic with any problems, questions or concerns. We can certainly see the patient much sooner if necessary.  The patient was discussed with Dr. Timmy and he is in agreement with the aforementioned.   Lauraine Pepper, NP           [1] No Known Allergies [2]  Current Outpatient Medications on File Prior to Visit  Medication Sig Dispense Refill   ARIPiprazole (ABILIFY) 5 MG tablet Take 5 mg by mouth every morning.     carbamazepine  (CARBATROL ) 300 MG 12 hr capsule Take 300 mg by mouth in the morning, at noon, and at bedtime.     clotrimazole (LOTRIMIN) 1 % cream Apply 1 application  topically daily at 6 (six) AM.     cromolyn (OPTICROM) 4 % ophthalmic solution Place 1 drop into both eyes 2 (two) times daily.     diazepam (VALIUM) 5 MG tablet Take 5 mg by mouth 2 (two) times daily as needed.     fluvoxaMINE (LUVOX) 100 MG tablet Take 100 mg by mouth 2 (two) times  daily.     gabapentin (NEURONTIN) 800 MG tablet Take 800 mg by mouth in the morning, at noon, in the evening, and at bedtime. (0800, 1200, 1600 & 2000)     GOODSENSE CLEARLAX 17 GM/SCOOP powder Take 17 g by mouth in the morning and at bedtime. 238 g 3   hydrochlorothiazide (MICROZIDE) 12.5 MG capsule Take 12.5 mg by mouth daily.     Incontinence Supply Disposable (PREVAIL BOXERS FOR MEN S-M) MISC by Miscellaneous  route.     nystatin-triamcinolone (MYCOLOG II) cream Apply 1 Application topically 3 (three) times daily.     omeprazole  (PRILOSEC) 20 MG capsule Take 20 mg by mouth 2 (two) times daily.     PATADAY 0.7 % SOLN Place 1 drop into both eyes every morning.     potassium chloride (KLOR-CON) 8 MEQ tablet Take 8 mEq by mouth daily.     tamsulosin (FLOMAX) 0.4 MG CAPS capsule Take 0.4 mg by mouth daily.     TERBINAFINE EX Apply topically. BID fungus on toenails     triamcinolone lotion (KENALOG) 0.1 % Apply 1 Application topically in the morning and at bedtime.     UNABLE TO FIND Med Name: Incontinence Disposable Diapers Mens Large 1 Box 5   UNABLE TO FIND Med Name: Medium Gloves (Patient not taking: Reported on 09/03/2024) 1 Box 5   UNABLE TO FIND Med Name: Large Gloves (Patient not taking: Reported on 09/03/2024) 1 Box 5   Wheat Dextrin (BENEFIBER) POWD Mix 1 tablespoon in 8 ounces of water  and give twice daily. 245 g 1   zolpidem (AMBIEN) 5 MG tablet Take 5 mg by mouth at bedtime.     No current facility-administered medications on file prior to visit.

## 2024-09-27 ENCOUNTER — Inpatient Hospital Stay

## 2024-09-27 NOTE — Patient Instructions (Signed)

## 2024-09-27 NOTE — Progress Notes (Signed)
 Tom Russell presents today for phlebotomy per MD orders. Phlebotomy procedure started at 1124 and ended at 1130. 500 cc removed. Patient tolerated procedure well. IV needle removed intact.   Patient tolerated phlebotomy with no complaints voiced.  Peripheral IV site intact with no bruising or swelling noted.  Denied SOB, chest pain, or dizziness.  Gauze with coban applied.  Discharged with VSS and no s/s of distress noted.

## 2024-10-01 ENCOUNTER — Ambulatory Visit: Payer: Self-pay

## 2024-10-04 ENCOUNTER — Telehealth: Payer: Self-pay

## 2024-10-04 NOTE — Telephone Encounter (Signed)
 Copied from CRM [id number]. Topic: Appointments - Scheduling Inquiry for Clinic >> Oct 04, 2024 10:20 AM Ivette P wrote: Reason for CRM: Ayana called in to notify cirrhosis of the liver, went to go see cancer in [place] - doing lab work and ws found out by gastro  - due to drug use. call and make appt. see what needs to be done. and has arthritis in hand s for wear and tear. cream . was going through pain and managing. changes in diet. whatever reccomendations.    Pt needs to be seen but next available appt not until March, needs to be seen before then. Please follow up with Ayana [phone]

## 2024-10-07 ENCOUNTER — Other Ambulatory Visit: Payer: Self-pay

## 2024-10-09 ENCOUNTER — Encounter: Payer: Self-pay | Admitting: Internal Medicine

## 2024-10-11 ENCOUNTER — Ambulatory Visit (INDEPENDENT_AMBULATORY_CARE_PROVIDER_SITE_OTHER): Admitting: Family Medicine

## 2024-10-11 ENCOUNTER — Encounter: Payer: Self-pay | Admitting: Family Medicine

## 2024-10-11 ENCOUNTER — Encounter: Attending: Physical Medicine & Rehabilitation | Admitting: Physical Medicine & Rehabilitation

## 2024-10-11 VITALS — BP 121/83 | HR 79 | Temp 97.2°F

## 2024-10-11 DIAGNOSIS — M62830 Muscle spasm of back: Secondary | ICD-10-CM | POA: Diagnosis not present

## 2024-10-11 DIAGNOSIS — G800 Spastic quadriplegic cerebral palsy: Secondary | ICD-10-CM | POA: Insufficient documentation

## 2024-10-11 MED ORDER — PREDNISONE 20 MG PO TABS: 40.0000 mg | ORAL_TABLET | Freq: Every day | ORAL | 0 refills | Status: AC

## 2024-10-11 NOTE — Progress Notes (Signed)
 "    Subjective:  Patient ID: Tom Russell, male    DOB: November 02, 1970, 54 y.o.   MRN: [id number]  Patient Care Team: Bevely Doffing, FNP as PCP - General (Family Medicine) Shaaron Lamar HERO, MD as Consulting Physician (Gastroenterology) Georjean Darice HERO, MD as Consulting Physician (Neurology)   Chief Complaint:  Back Pain (X 2 weeks )   HPI: Tom Russell is a 54 y.o. male presenting on 10/11/2024 for Back Pain (X 2 weeks )  Tom Russell is a 54 year old male who presents with back pain.  He has been experiencing back pain primarily across his shoulders for the past two weeks. The pain occurs every time he gets out of bed and is located across his shoulders. No recent injuries or falls have contributed to the pain.  He has been using ibuprofen for symptom relief, but due to his cirrhosis of the liver, he has been advised against its use. He is currently on Valium for muscle relaxation and gabapentin four times a day, which he has been taking as prescribed. Additionally, a muscle relaxing cream has been applied to the affected area, but he continues to experience discomfort.  He resides at [facility], and his medications are obtained from [facility].          Relevant past medical, surgical, family, and social history reviewed and updated as indicated.  Allergies and medications reviewed and updated. Data reviewed: Chart in Epic.   Past Medical History:  Diagnosis Date   Anxiety    Cerebral palsy (HCC)    Depression    GERD (gastroesophageal reflux disease)    Mental retardation    Seizures (HCC)     Past Surgical History:  Procedure Laterality Date   COLONOSCOPY WITH PROPOFOL  N/A 05/24/2023   Procedure: COLONOSCOPY WITH PROPOFOL ;  Surgeon: Shaaron Lamar HERO, MD;  Location[facility];  Service: Endoscopy;  Laterality: N/A;  930am, asa 3, group home patient   ESOPHAGOGASTRODUODENOSCOPY (EGD) WITH PROPOFOL  N/A 11/23/2017   normal esophagus, single 4 mm semi-sessile  polyp in gastric fundus, duodenal bulb normal, s/p biopsy. Fundic polyp, negative H.pylori.    None to date     as of 11/14/16    Social History   Socioeconomic History   Marital status: Single    Spouse name: Not on file   Number of children: Not on file   Years of education: Not on file   Highest education level: Not on file  Occupational History   Not on file  Tobacco Use   Smoking status: Never    Passive exposure: Never   Smokeless tobacco: Never  Vaping Use   Vaping status: Never Used  Substance and Sexual Activity   Alcohol  use: No   Drug use: No   Sexual activity: Never    Birth control/protection: None  Other Topics Concern   Not on file  Social History Narrative   [facility]       Right hand       Social Drivers of Health   Tobacco Use: Low Risk (10/11/2024)   Patient History    Smoking Tobacco Use: Never    Smokeless Tobacco Use: Never    Passive Exposure: Never  Financial Resource Strain: Low Risk (07/25/2024)   Overall Financial Resource Strain (CARDIA)    Difficulty of Paying Living Expenses: Not hard at all  Food Insecurity: No Food Insecurity (07/25/2024)   Epic    Worried About  Running Out of Food in the Last Year: Never true    Ran Out of Food in the Last Year: Never true  Transportation Needs: No Transportation Needs (07/25/2024)   Epic    Lack of Transportation (Medical): No    Lack of Transportation (Non-Medical): No  Physical Activity: Inactive (07/25/2024)   Exercise Vital Sign    Days of Exercise per Week: 0 days    Minutes of Exercise per Session: 0 min  Stress: No Stress Concern Present (07/25/2024)   [employer] of Occupational Health - Occupational Stress Questionnaire    Feeling of Stress: Not at all  Social Connections: Unknown (07/25/2024)   Social Connection and Isolation Panel    Frequency of Communication with Friends and Family: More than three times a week    Frequency of Social Gatherings with Friends  and Family: More than three times a week    Attends Religious Services: More than 4 times per year    Active Member of [employer] or Organizations: No    Attends Banker Meetings: Never    Marital Status: Patient declined  Catering Manager Violence: Not At Risk (07/25/2024)   Epic    Fear of Current or Ex-Partner: No    Emotionally Abused: No    Physically Abused: No    Sexually Abused: No  Depression (PHQ2-9): Low Risk (09/27/2024)   Depression (PHQ2-9)    PHQ-2 Score: 0  Alcohol  Screen: Low Risk (07/25/2024)   Alcohol  Screen    Last Alcohol  Screening Score (AUDIT): 0  Housing: Low Risk (07/25/2024)   Epic    Unable to Pay for Housing in the Last Year: No    Number of Times Moved in the Last Year: 0    Homeless in the Last Year: No  Utilities: Not At Risk (07/25/2024)   Epic    Threatened with loss of utilities: No  Health Literacy: Adequate Health Literacy (07/25/2024)   B1300 Health Literacy    Frequency of need for help with medical instructions: Never    Outpatient Encounter Medications as of 10/11/2024  Medication Sig   ammonium lactate (AMLACTIN) 12 % cream Apply 1 Application topically 2 (two) times daily.   ARIPiprazole (ABILIFY) 5 MG tablet Take 5 mg by mouth every morning.   carbamazepine  (CARBATROL ) 300 MG 12 hr capsule Take 300 mg by mouth in the morning, at noon, and at bedtime.   clotrimazole (LOTRIMIN) 1 % cream Apply 1 application  topically daily at 6 (six) AM.   cromolyn (OPTICROM) 4 % ophthalmic solution Place 1 drop into both eyes 2 (two) times daily.   diazepam (VALIUM) 5 MG tablet Take 5 mg by mouth 2 (two) times daily as needed.   fluvoxaMINE (LUVOX) 100 MG tablet Take 100 mg by mouth 2 (two) times daily.   gabapentin (NEURONTIN) 800 MG tablet Take 800 mg by mouth in the morning, at noon, in the evening, and at bedtime. (0800, 1200, 1600 & 2000)   GOODSENSE CLEARLAX 17 GM/SCOOP powder Take 17 g by mouth in the morning and at bedtime.    hydrochlorothiazide (MICROZIDE) 12.5 MG capsule Take 12.5 mg by mouth daily.   Incontinence Supply Disposable (PREVAIL BOXERS FOR MEN S-M) MISC by Miscellaneous route.   nystatin-triamcinolone (MYCOLOG II) cream Apply 1 Application topically 3 (three) times daily.   omeprazole  (PRILOSEC) 20 MG capsule Take 20 mg by mouth 2 (two) times daily.   PATADAY 0.7 % SOLN Place 1 drop into both eyes every morning.  potassium chloride (KLOR-CON) 8 MEQ tablet Take 8 mEq by mouth daily.   predniSONE (DELTASONE) 20 MG tablet Take 2 tablets (40 mg total) by mouth daily with breakfast for 5 days.   tamsulosin (FLOMAX) 0.4 MG CAPS capsule Take 0.4 mg by mouth daily.   TERBINAFINE EX Apply topically. BID fungus on toenails   triamcinolone lotion (KENALOG) 0.1 % Apply 1 Application topically in the morning and at bedtime.   UNABLE TO FIND Med Name: Incontinence Disposable Diapers Mens Large   UNABLE TO FIND Med Name: Medium Gloves   UNABLE TO FIND Med Name: Large Gloves   Wheat Dextrin (BENEFIBER) POWD Mix 1 tablespoon in 8 ounces of water  and give twice daily.   zolpidem (AMBIEN) 5 MG tablet Take 5 mg by mouth at bedtime.   No facility-administered encounter medications on file as of 10/11/2024.    Allergies[1]  Pertinent ROS per HPI, otherwise unremarkable      Objective:  BP 121/83   Pulse 79   Temp (!) 97.2 F (36.2 C)   SpO2 99%    Wt Readings from Last 3 Encounters:  08/05/24 170 lb (77.1 kg)  07/25/24 168 lb (76.2 kg)  07/12/24 170 lb (77.1 kg)    Physical Exam Vitals and nursing note reviewed.  Constitutional:      General: He is not in acute distress.    Appearance: He is ill-appearing (chronically ill). He is not toxic-appearing.  HENT:     Head: Normocephalic.     Mouth/Throat:     Mouth: Mucous membranes are moist.  Eyes:     Conjunctiva/sclera: Conjunctivae normal.     Comments: Misaligned gaze  Cardiovascular:     Rate and Rhythm: Normal rate and regular rhythm.      Heart sounds: Normal heart sounds.  Pulmonary:     Effort: Pulmonary effort is normal.     Breath sounds: Normal breath sounds.  Skin:    General: Skin is warm and dry.     Capillary Refill: Capillary refill takes less than 2 seconds.  Neurological:     Mental Status: He is alert.     Gait: Gait abnormal (in wheelchair).    Physical Exam   MUSCULOSKELETAL: Tenderness in paraspinous and trapezius muscles. No tenderness in the midline of the back.        Results for orders placed or performed in visit on 09/23/24  Iron and TIBC ([facility] ONLY)   Collection Time: 09/23/24  3:53 PM  Result Value Ref Range   Iron 213 (H) 45 - 182 ug/dL   TIBC 764 (L) 749 - 549 ug/dL   Saturation Ratios 91 (H) 17.9 - 39.5 %   UIBC 22 ug/dL  Ferritin   Collection Time: 09/23/24  3:53 PM  Result Value Ref Range   Ferritin 2,865 (H) 24 - 336 ng/mL       Pertinent labs & imaging results that were available during my care of the patient were reviewed by me and considered in my medical decision making.  Assessment & Plan:  Tom Russell was seen today for back pain.  Diagnoses and all orders for this visit:  Muscle spasm of back -     predniSONE (DELTASONE) 20 MG tablet; Take 2 tablets (40 mg total) by mouth daily with breakfast for 5 days.      Muscle spasm of back Muscle spasm in the paraspinous and trapezius muscles for two weeks, likely muscular in origin. No recent injuries or falls. Current medications  include Valium and gabapentin. Ibuprofen is contraindicated due to cirrhosis and potential bleeding risks. Tylenol  is also contraindicated due to cirrhosis. Muscle relaxant cream is being used. - Prescribed prednisone for five days to reduce inflammation. - Continue using muscle relaxant cream such as Biofreeze, Icyhot, or Bengay. - Apply heat to affected area. - Follow up with primary care if symptoms do not improve or worsen.          Continue all other maintenance  medications.  Follow up plan: Return if symptoms worsen or fail to improve.   Continue healthy lifestyle choices, including diet (rich in fruits, vegetables, and lean proteins, and low in salt and simple carbohydrates) and exercise (at least 30 minutes of moderate physical activity daily).  Educational handout given for muscle cramps  The above assessment and management plan was discussed with the patient. The patient verbalized understanding of and has agreed to the management plan. Patient is aware to call the clinic if they develop any new symptoms or if symptoms persist or worsen. Patient is aware when to return to the clinic for a follow-up visit. Patient educated on when it is appropriate to go to the emergency department.   Tom Bruns, FNP-C [facility] [phone]     [1] No Known Allergies  "

## 2024-10-22 ENCOUNTER — Telehealth: Payer: Self-pay

## 2024-10-22 ENCOUNTER — Ambulatory Visit: Payer: Self-pay

## 2024-10-22 NOTE — Telephone Encounter (Signed)
Noted patient scheduled

## 2024-10-22 NOTE — Telephone Encounter (Signed)
 Copied from CRM #[id number]. Topic: Clinical - Lab/Test Results >> Oct 22, 2024  8:47 AM Tonda B wrote: Reason for CRM: caregiver ms hampton calling needing pt info about his last lab work as well as physical info please call [phone]

## 2024-10-22 NOTE — Telephone Encounter (Signed)
" °  FYI Only or Action Required?: FYI only for provider: appointment scheduled on 1/16.  Patient was last seen in primary care on 10/11/2024 by Severa Rock HERO, FNP.  Called Nurse Triage reporting Back Pain.  Symptoms began several weeks ago.  Interventions attempted: Prescription medications: prednisone .  Symptoms are: unchanged.  Triage Disposition: See PCP Within 2 Weeks  Patient/caregiver understands and will follow disposition?: Yes   Copied from CRM [id number]. Topic: Clinical - Red Word Triage >> Oct 22, 2024  8:50 AM Tonda B wrote: [facility] that prompted transfer to Nurse Triage:  pt care giver ms hampton patient is having a lot of back Reason for Disposition  Back pain present > 2 weeks  Answer Assessment - Initial Assessment Questions 1. ONSET: When did the pain begin? (e.g., minutes, hours, days)     1/2 per caregiver  2. LOCATION: Where does it hurt? (upper, mid or lower back)     Lower back per care giver  3. SEVERITY: How bad is the pain?  (e.g., Scale 1-10; mild, moderate, or severe)     2/10  4. PATTERN: Is the pain constant? (e.g., yes, no; constant, intermittent)      Constant per caregiver   6. CAUSE:  What do you think is causing the back pain?      Caregiver unsure   8. MEDICINES: What have you taken so far for the pain? (e.g., nothing, acetaminophen , NSAIDS)     5 day prednisone  began on 1/7, reports the pain persisted. Caregiver hesitant to give ibuprofen since one doctor said not to take it and another said it was okay.    10. OTHER SYMPTOMS: Do you have any other symptoms?        Denies  Protocols used: Back Pain-A-AH  "

## 2024-10-24 NOTE — Telephone Encounter (Signed)
 Patient appointment tomorrow , Tom Russell said we can discuss tomorrow at appointment

## 2024-10-24 NOTE — Telephone Encounter (Signed)
 His labs from October showed normal blood sugar levels.  Cholesterol was slightly elevated.  His platelet levels were low at that time but he is being seen for this by hematology.  Overall labs were stable.

## 2024-10-25 ENCOUNTER — Ambulatory Visit

## 2024-10-25 VITALS — BP 117/61 | HR 104

## 2024-10-25 DIAGNOSIS — M549 Dorsalgia, unspecified: Secondary | ICD-10-CM

## 2024-10-25 MED ORDER — CYCLOBENZAPRINE HCL 5 MG PO TABS: 5.0000 mg | ORAL_TABLET | Freq: Three times a day (TID) | ORAL | 5 refills | Status: DC | PRN

## 2024-10-25 MED ORDER — CYCLOBENZAPRINE HCL 5 MG PO TABS: 5.0000 mg | ORAL_TABLET | Freq: Three times a day (TID) | ORAL | 5 refills | Status: AC | PRN

## 2024-10-25 NOTE — Progress Notes (Unsigned)
" ° °  Established Patient Office Visit  Subjective   Patient ID: Tom Russell, male    DOB: 06-Jun-1971  Age: 54 y.o. MRN: [id number]  Chief Complaint  Patient presents with   Back Pain    Back pain for three weeks    HPI  {History (Optional):23778}  ROS    Objective:     BP 117/61   Pulse (!) 104   SpO2 99%  {Vitals History (Optional):23777}  Physical Exam   No results found for any visits on 10/25/24.  {Labs (Optional):23779}  The ASCVD Risk score (Arnett DK, et al., 2019) failed to calculate for the following reasons:   Risk score cannot be calculated because patient has a medical history suggesting prior/existing ASCVD   * - Cholesterol units were assumed    Assessment & Plan:   Problem List Items Addressed This Visit   None Visit Diagnoses       Upper back pain    -  Primary   Relevant Medications   cyclobenzaprine  (FLEXERIL ) 5 MG tablet   Other Relevant Orders   DG Cervical Spine Complete       No follow-ups on file.    Leita Longs, FNP  "

## 2024-10-29 ENCOUNTER — Encounter: Payer: Self-pay | Admitting: Physical Medicine & Rehabilitation

## 2024-10-29 ENCOUNTER — Encounter: Admitting: Physical Medicine & Rehabilitation

## 2024-10-29 VITALS — BP 128/85 | HR 72 | Ht 67.0 in

## 2024-10-29 DIAGNOSIS — M549 Dorsalgia, unspecified: Secondary | ICD-10-CM | POA: Insufficient documentation

## 2024-10-29 DIAGNOSIS — G800 Spastic quadriplegic cerebral palsy: Secondary | ICD-10-CM | POA: Diagnosis not present

## 2024-10-29 MED ORDER — SODIUM CHLORIDE (PF) 0.9 % IJ SOLN
8.0000 mL | Freq: Once | INTRAMUSCULAR | Status: AC
  Administered 2024-10-29: 8 mL

## 2024-10-29 MED ORDER — ONABOTULINUMTOXINA 100 UNITS IJ SOLR
400.0000 [IU] | Freq: Once | INTRAMUSCULAR | Status: AC
  Administered 2024-10-29: 400 [IU] via INTRAMUSCULAR

## 2024-10-29 NOTE — Patient Instructions (Signed)

## 2024-10-29 NOTE — Progress Notes (Signed)
 Botox Injection for spasticity using needle EMG guidance  Dilution: 50 Units/ml Indication: Severe spasticity which interferes with ADL,mobility and/or  hygiene and is unresponsive to medication management and other conservative care Informed consent was obtained after describing risks and benefits of the procedure with the patient. This includes bleeding, bruising, infection, excessive weakness, or medication side effects. A REMS form is on file and signed. Needle: 50mm 25g needle electrode Number of units per muscle Right  Hamstrings200 Left  Hamstrings 200 All injections were done after obtaining appropriate EMG activity and after negative drawback for blood. The patient tolerated the procedure well. Post procedure instructions were given. A followup appointment was made.

## 2024-10-29 NOTE — Assessment & Plan Note (Signed)
 Persistent pain in muscles, worsened by poor posture and prolonged sitting. - Prescribed muscle relaxer up to three times daily. - Ordered neck x-ray to assess spinal issues.

## 2024-11-21 ENCOUNTER — Ambulatory Visit: Admitting: Gastroenterology

## 2024-11-26 ENCOUNTER — Ambulatory Visit

## 2024-12-09 ENCOUNTER — Ambulatory Visit: Admitting: Neurology

## 2024-12-11 ENCOUNTER — Ambulatory Visit: Admitting: Neurology

## 2025-01-28 ENCOUNTER — Encounter: Admitting: Physical Medicine & Rehabilitation

## 2025-02-20 ENCOUNTER — Ambulatory Visit: Payer: Self-pay

## 2025-07-29 ENCOUNTER — Ambulatory Visit

## 2025-08-01 ENCOUNTER — Ambulatory Visit
